# Patient Record
Sex: Female | Born: 1985 | Race: Black or African American | Hispanic: No | Marital: Single | Smoking: Never smoker
Health system: Southern US, Community
[De-identification: ages and names within clinical notes are randomized; demographics above are authoritative.]

## PROBLEM LIST (undated history)

## (undated) ENCOUNTER — Emergency Department (HOSPITAL_COMMUNITY): Admission: EM | Payer: 59

## (undated) ENCOUNTER — Inpatient Hospital Stay (HOSPITAL_COMMUNITY): Payer: Self-pay

## (undated) DIAGNOSIS — K219 Gastro-esophageal reflux disease without esophagitis: Secondary | ICD-10-CM

## (undated) DIAGNOSIS — I10 Essential (primary) hypertension: Secondary | ICD-10-CM

## (undated) DIAGNOSIS — D649 Anemia, unspecified: Secondary | ICD-10-CM

## (undated) DIAGNOSIS — Z8619 Personal history of other infectious and parasitic diseases: Secondary | ICD-10-CM

## (undated) DIAGNOSIS — M9250 Juvenile osteochondrosis of tibia and fibula, unspecified leg: Secondary | ICD-10-CM

## (undated) DIAGNOSIS — D563 Thalassemia minor: Secondary | ICD-10-CM

## (undated) DIAGNOSIS — O039 Complete or unspecified spontaneous abortion without complication: Secondary | ICD-10-CM

## (undated) DIAGNOSIS — M92519 Juvenile osteochondrosis of proximal tibia, unspecified leg: Secondary | ICD-10-CM

## (undated) HISTORY — PX: LEG SURGERY: SHX1003

## (undated) HISTORY — DX: Thalassemia minor: D56.3

## (undated) HISTORY — DX: Gastro-esophageal reflux disease without esophagitis: K21.9

## (undated) HISTORY — PX: TONSILLECTOMY: SUR1361

## (undated) HISTORY — DX: Juvenile osteochondrosis of proximal tibia, unspecified leg: M92.519

## (undated) HISTORY — DX: Juvenile osteochondrosis of tibia and fibula, unspecified leg: M92.50

## (undated) HISTORY — DX: Anemia, unspecified: D64.9

## (undated) HISTORY — DX: Personal history of other infectious and parasitic diseases: Z86.19

---

## 2014-10-12 ENCOUNTER — Emergency Department (HOSPITAL_COMMUNITY)
Admission: EM | Admit: 2014-10-12 | Discharge: 2014-10-12 | Discharge status: Absent without leave | Payer: Self-pay | Source: Home / Self Care

## 2014-10-16 LAB — OB RESULTS CONSOLE HIV ANTIBODY (ROUTINE TESTING): HIV: NONREACTIVE

## 2014-10-16 LAB — OB RESULTS CONSOLE HEPATITIS B SURFACE ANTIGEN: Hepatitis B Surface Ag: NEGATIVE

## 2014-10-16 LAB — OB RESULTS CONSOLE ABO/RH: RH TYPE: POSITIVE

## 2014-10-16 LAB — OB RESULTS CONSOLE ANTIBODY SCREEN: Antibody Screen: NEGATIVE

## 2014-10-16 LAB — OB RESULTS CONSOLE GC/CHLAMYDIA
Chlamydia: NEGATIVE
GC PROBE AMP, GENITAL: NEGATIVE

## 2014-10-16 LAB — OB RESULTS CONSOLE RPR: RPR: NONREACTIVE

## 2014-10-16 LAB — OB RESULTS CONSOLE RUBELLA ANTIBODY, IGM: Rubella: IMMUNE

## 2014-10-18 ENCOUNTER — Encounter (HOSPITAL_COMMUNITY): Payer: Self-pay | Admitting: Emergency Medicine

## 2014-10-18 ENCOUNTER — Emergency Department (HOSPITAL_COMMUNITY)
Admission: EM | Admit: 2014-10-18 | Discharge: 2014-10-18 | Disposition: A | Discharge status: Home / Self Care | Payer: Medicaid Other | Attending: Emergency Medicine | Admitting: Emergency Medicine

## 2014-10-18 DIAGNOSIS — Z3A08 8 weeks gestation of pregnancy: Secondary | ICD-10-CM | POA: Insufficient documentation

## 2014-10-18 DIAGNOSIS — O23591 Infection of other part of genital tract in pregnancy, first trimester: Secondary | ICD-10-CM | POA: Insufficient documentation

## 2014-10-18 DIAGNOSIS — B9689 Other specified bacterial agents as the cause of diseases classified elsewhere: Secondary | ICD-10-CM

## 2014-10-18 DIAGNOSIS — O2311 Infections of bladder in pregnancy, first trimester: Secondary | ICD-10-CM | POA: Diagnosis not present

## 2014-10-18 DIAGNOSIS — O210 Mild hyperemesis gravidarum: Secondary | ICD-10-CM | POA: Diagnosis present

## 2014-10-18 DIAGNOSIS — N3000 Acute cystitis without hematuria: Secondary | ICD-10-CM

## 2014-10-18 DIAGNOSIS — N760 Acute vaginitis: Secondary | ICD-10-CM

## 2014-10-18 HISTORY — DX: Complete or unspecified spontaneous abortion without complication: O03.9

## 2014-10-18 LAB — URINALYSIS, ROUTINE W REFLEX MICROSCOPIC
Bilirubin Urine: NEGATIVE
Glucose, UA: NEGATIVE mg/dL
Hgb urine dipstick: NEGATIVE
Ketones, ur: 40 mg/dL — AB
LEUKOCYTES UA: NEGATIVE
NITRITE: NEGATIVE
PROTEIN: 30 mg/dL — AB
Specific Gravity, Urine: 1.04 — ABNORMAL HIGH (ref 1.005–1.030)
UROBILINOGEN UA: 1 mg/dL (ref 0.0–1.0)
pH: 6 (ref 5.0–8.0)

## 2014-10-18 LAB — COMPREHENSIVE METABOLIC PANEL
ALT: 21 U/L (ref 0–35)
ANION GAP: 14 (ref 5–15)
AST: 18 U/L (ref 0–37)
Albumin: 3.7 g/dL (ref 3.5–5.2)
Alkaline Phosphatase: 40 U/L (ref 39–117)
BUN: 8 mg/dL (ref 6–23)
CALCIUM: 9.8 mg/dL (ref 8.4–10.5)
CO2: 21 mEq/L (ref 19–32)
Chloride: 99 mEq/L (ref 96–112)
Creatinine, Ser: 0.63 mg/dL (ref 0.50–1.10)
GFR calc Af Amer: >90 mL/min (ref >90)
GFR calc non Af Amer: >90 mL/min (ref >90)
GLUCOSE: 105 mg/dL — AB (ref 70–99)
Potassium: 3.6 mEq/L — ABNORMAL LOW (ref 3.7–5.3)
SODIUM: 134 meq/L — AB (ref 137–147)
TOTAL PROTEIN: 7.4 g/dL (ref 6.0–8.3)
Total Bilirubin: 0.3 mg/dL (ref 0.3–1.2)

## 2014-10-18 LAB — CBC WITH DIFFERENTIAL/PLATELET
BASOS PCT: 0 % (ref 0–1)
Basophils Absolute: 0 10*3/uL (ref 0.0–0.1)
EOS PCT: 0 % (ref 0–5)
Eosinophils Absolute: 0 10*3/uL (ref 0.0–0.7)
HCT: 34.8 % — ABNORMAL LOW (ref 36.0–46.0)
Hemoglobin: 11.6 g/dL — ABNORMAL LOW (ref 12.0–15.0)
LYMPHS ABS: 2.3 10*3/uL (ref 0.7–4.0)
Lymphocytes Relative: 33 % (ref 12–46)
MCH: 24.3 pg — ABNORMAL LOW (ref 26.0–34.0)
MCHC: 33.3 g/dL (ref 30.0–36.0)
MCV: 73 fL — AB (ref 78.0–100.0)
Monocytes Absolute: 0.7 10*3/uL (ref 0.1–1.0)
Monocytes Relative: 11 % (ref 3–12)
Neutro Abs: 3.8 10*3/uL (ref 1.7–7.7)
Neutrophils Relative %: 56 % (ref 43–77)
Platelets: 236 10*3/uL (ref 150–400)
RBC: 4.77 MIL/uL (ref 3.87–5.11)
RDW: 13.4 % (ref 11.5–15.5)
WBC: 6.9 10*3/uL (ref 4.0–10.5)

## 2014-10-18 LAB — URINE MICROSCOPIC-ADD ON

## 2014-10-18 LAB — WET PREP, GENITAL
TRICH WET PREP: NONE SEEN
Yeast Wet Prep HPF POC: NONE SEEN

## 2014-10-18 LAB — LIPASE, BLOOD: Lipase: 16 U/L (ref 11–59)

## 2014-10-18 MED ORDER — SODIUM CHLORIDE 0.9 % IV BOLUS (SEPSIS)
1000.0000 mL | Freq: Once | INTRAVENOUS | Status: AC
Start: 2014-10-18 — End: 2014-10-18
  Administered 2014-10-18: 1000 mL via INTRAVENOUS

## 2014-10-18 MED ORDER — METRONIDAZOLE 500 MG PO TABS: 500.0000 mg | ORAL_TABLET | Freq: Two times a day (BID) | ORAL | Status: DC

## 2014-10-18 MED ORDER — CEPHALEXIN 500 MG PO CAPS: 500.0000 mg | ORAL_CAPSULE | Freq: Two times a day (BID) | ORAL | Status: DC

## 2014-10-18 MED ORDER — ONDANSETRON 4 MG PO TBDP: 4.0000 mg | ORAL_TABLET | Freq: Three times a day (TID) | ORAL | Status: DC | PRN

## 2014-10-18 MED ORDER — ONDANSETRON HCL 4 MG/2ML IJ SOLN
4.0000 mg | Freq: Once | INTRAMUSCULAR | Status: AC
  Administered 2014-10-18: 4 mg via INTRAVENOUS
  Filled 2014-10-18: qty 2

## 2014-10-18 NOTE — ED Notes (Signed)
Pt. reports persistent nausea/vomitting for 3 days , pt. stated she is [redacted] weeks pregnant ( G2P0) .

## 2014-10-18 NOTE — Discharge Instructions (Signed)
Urinary Tract Infection Destiny Jones, you were seen today for abdominal pain and vomiting. Your urine shows bacteria, and your pelvic exam shows BV. Take antibiotics as prescribed and follow-up with an OB/GYN physician within 3 days. You can take Tylenol for pain.  Do not take any medication for your cold symptoms as the common cold will eventually go away. If any of her symptoms worsen come back to the emergency department immediately for repeat evaluation. Thank you. A urinary tract infection (UTI) can occur any place along the urinary tract. The tract includes the kidneys, ureters, bladder, and urethra. A type of germ called bacteria often causes a UTI. UTIs are often helped with antibiotic medicine.  HOME CARE   If given, take antibiotics as told by your doctor. Finish them even if you start to feel better.  Drink enough fluids to keep your pee (urine) clear or pale yellow.  Avoid tea, drinks with caffeine, and bubbly (carbonated) drinks.  Pee often. Avoid holding your pee in for a long time.  Pee before and after having sex (intercourse).  Wipe from front to back after you poop (bowel movement) if you are a woman. Use each tissue only once. GET HELP RIGHT AWAY IF:   You have back pain.  You have lower belly (abdominal) pain.  You have chills.  You feel sick to your stomach (nauseous).  You throw up (vomit).  Your burning or discomfort with peeing does not go away.  You have a fever.  Your symptoms are not better in 3 days. MAKE SURE YOU:   Understand these instructions.  Will watch your condition.  Will get help right away if you are not doing well or get worse. Document Released: 04/13/2008 Document Revised: 07/20/2012 Document Reviewed: 05/26/2012 [facility] Patient Information 2015 [place], [place]. This information is not intended to replace advice given to you by your health care provider. Make sure you discuss any questions you have with your health care  provider.  Bacterial Vaginosis Bacterial vaginosis is an infection of the vagina. It happens when too many of certain germs (bacteria) grow in the vagina. HOME CARE  Take your medicine as told by your doctor.  Finish your medicine even if you start to feel better.  Do not have sex until you finish your medicine and are better.  Tell your sex partner that you have an infection. They should see their doctor for treatment.  Practice safe sex. Use condoms. Have only one sex partner. GET HELP IF:  You are not getting better after 3 days of treatment.  You have more grey fluid (discharge) coming from your vagina than before.  You have more pain than before.  You have a fever. MAKE SURE YOU:   Understand these instructions.  Will watch your condition.  Will get help right away if you are not doing well or get worse. Document Released: 08/04/2008 Document Revised: 08/16/2013 Document Reviewed: 06/07/2013 [facility] Patient Information 2015 [place], [place]LL[place]. This information is not intended to replace advice given to you by your health care provider. Make sure you discuss any questions you have with your health care provider.

## 2014-10-18 NOTE — ED Provider Notes (Signed)
CSN: [id number]     Arrival date & time 10/18/14  0229 History  This chart was scribed for Destiny Jones Brennan Karam, MD by Karle PlumberJennifer Tensley, ED Scribe. This patient was seen in room D34C/D34C and the patient's care was started at 2:59 AM.  Chief Complaint  Patient presents with  . Emesis During Pregnancy   The history is provided by the patient. No language interpreter was used.    HPI Comments:  Talbot Grumblingdrian Seedorf is a 28 y.o. pregnant female at 808 weeks gestation, with PMH of miscarriage who presents to the Emergency Department complaining of nausea and vomiting (one episode per day secondary to eating) that started three days ago. She reports associated abdominal cramping, dysuria (she feels is secondary to not being able to drink anything) and constipation. She states she has not been able to keep anything on her stomach, eating only once daily, and reports feeling dehydrated and weak. Pt has recently had a cold and states she has had chest congestion. Eating and drinking makes her symptoms worse. Denies alleviating factors. Denies fever, hematuria, pain or swelling of the lower extremities. Pt states she has her first prenatal visit scheduled.   Past Medical History  Diagnosis Date  . Miscarriage    Past Surgical History  Procedure Laterality Date  . Tonsillectomy    . Leg surgery     No family history on file. History  Substance Use Topics  . Smoking status: Never Smoker   . Smokeless tobacco: Not on file  . Alcohol Use: Yes   OB History    No data available     Review of Systems  Gastrointestinal: Positive for nausea and vomiting.  All other systems reviewed and are negative.   Allergies  Review of patient's allergies indicates no known allergies.  Home Medications   Prior to Admission medications   Not on File   Triage Vitals: BP 133/71 mmHg  Pulse 91  Temp(Src) 97.9 F (36.6 C) (Oral)  Resp 14  SpO2 100% Physical Exam  Constitutional: She is oriented to person, place, and  time. She appears well-developed and well-nourished. No distress.  HENT:  Head: Normocephalic and atraumatic.  Eyes: Conjunctivae and EOM are normal. Pupils are equal, round, and reactive to light. No scleral icterus.  Neck: Normal range of motion. Neck supple. No JVD present. No tracheal deviation present. No thyromegaly present.  Cardiovascular: Normal rate, regular rhythm and normal heart sounds.  Exam reveals no gallop and no friction rub.   No murmur heard. Pulmonary/Chest: Effort normal and breath sounds normal. No respiratory distress. She has no wheezes. She exhibits no tenderness.  Abdominal: Soft. Bowel sounds are normal. She exhibits no distension and no mass. There is tenderness (suprapubic). There is no rebound and no guarding.  Genitourinary:  Live IUP. Fetal heart visualized on bedside ultrasound.  Mild amount of vaginal discharge seen in the vaginal canal. No CMT. No adnexal tenderness.  Musculoskeletal: Normal range of motion. She exhibits no edema or tenderness.  Lymphadenopathy:    She has no cervical adenopathy.  Neurological: She is alert and oriented to person, place, and time.  Skin: Skin is warm and dry. No rash noted. No erythema. No pallor.  Nursing note and vitals reviewed.   ED Course  Procedures (including critical care time) DIAGNOSTIC STUDIES: Oxygen Saturation is 100% on RA, normal by my interpretation.   COORDINATION OF CARE: 3:06 AM- Will order lab work, urinalysis, IV fluids and ultrasound. Offered nausea medication but pt declined stating she  is only nauseous after eating. Will perform pelvic exam. Pt verbalizes understanding and agrees to plan.  Medications  sodium chloride 0.9 % bolus 1,000 mL (not administered)  ondansetron (ZOFRAN) injection 4 mg (not administered)   Labs Review Labs Reviewed  WET PREP, GENITAL - Abnormal; Notable for the following:    Clue Cells Wet Prep HPF POC MANY (*)    WBC, Wet Prep HPF POC FEW (*)    All other  components within normal limits  CBC WITH DIFFERENTIAL - Abnormal; Notable for the following:    Hemoglobin 11.6 (*)    HCT 34.8 (*)    MCV 73.0 (*)    MCH 24.3 (*)    All other components within normal limits  COMPREHENSIVE METABOLIC PANEL - Abnormal; Notable for the following:    Sodium 134 (*)    Potassium 3.6 (*)    Glucose, Bld 105 (*)    All other components within normal limits  URINALYSIS, ROUTINE W REFLEX MICROSCOPIC - Abnormal; Notable for the following:    APPearance CLOUDY (*)    Specific Gravity, Urine 1.040 (*)    Ketones, ur 40 (*)    Protein, ur 30 (*)    All other components within normal limits  URINE MICROSCOPIC-ADD ON - Abnormal; Notable for the following:    Squamous Epithelial / LPF MANY (*)    Bacteria, UA MANY (*)    All other components within normal limits  URINE CULTURE  GC/CHLAMYDIA PROBE AMP  LIPASE, BLOOD    Imaging Review No results found.   EKG Interpretation None      MDM   Final diagnoses:  None    Patient does emergency department for nausea and vomiting in the setting of pregnancy. Bedside ultrasound revealed a live IUP in the fetal heart rate was visualized. Will evaluate with laboratory studies, urinalysis as the patient is symptomatic. She was given Zofran for nausea relief.  Her symptoms are improved with Zofran. Urinalysis shows bacteriuria which needs to be treated in the setting of pregnancy. Also patient has having malodorous vaginal discharge and clue cells on exam. To be sent home with Keflex and Flagyl for treatment. She is advised to follow-up with an OB/GYN physician within 3 days for continued management. Given a prescription for Zofran as well. Her vital signs were within her normal limits and she is safe for discharge.  I personally performed the services described in this documentation, which was scribed in my presence. The recorded information has been reviewed and is accurate.    Destiny Jones Hanne Kegg, MD 10/18/14 [phone]

## 2014-10-18 NOTE — ED Notes (Signed)
Pt. Left with all belongings and refused wheelchair 

## 2014-10-19 LAB — URINE CULTURE: Colony Count: >=100000

## 2014-10-19 LAB — GC/CHLAMYDIA PROBE AMP
CT PROBE, AMP APTIMA: NEGATIVE
GC Probe RNA: NEGATIVE

## 2014-10-23 ENCOUNTER — Inpatient Hospital Stay (HOSPITAL_COMMUNITY)
Admission: AD | Admit: 2014-10-23 | Discharge: 2014-10-23 | Disposition: A | Discharge status: Home / Self Care | Payer: Medicaid Other | Source: Ambulatory Visit | Attending: Obstetrics and Gynecology | Admitting: Obstetrics and Gynecology

## 2014-10-23 ENCOUNTER — Encounter (HOSPITAL_COMMUNITY): Payer: Self-pay | Admitting: *Deleted

## 2014-10-23 DIAGNOSIS — Z3A09 9 weeks gestation of pregnancy: Secondary | ICD-10-CM | POA: Insufficient documentation

## 2014-10-23 DIAGNOSIS — O210 Mild hyperemesis gravidarum: Secondary | ICD-10-CM | POA: Insufficient documentation

## 2014-10-23 DIAGNOSIS — R112 Nausea with vomiting, unspecified: Secondary | ICD-10-CM | POA: Diagnosis present

## 2014-10-23 LAB — URINE MICROSCOPIC-ADD ON

## 2014-10-23 LAB — URINALYSIS, ROUTINE W REFLEX MICROSCOPIC
GLUCOSE, UA: NEGATIVE mg/dL
KETONES UR: 15 mg/dL — AB
Nitrite: NEGATIVE
PH: 6 (ref 5.0–8.0)
Protein, ur: 30 mg/dL — AB
Specific Gravity, Urine: >1.03 — ABNORMAL HIGH (ref 1.005–1.030)
Urobilinogen, UA: 0.2 mg/dL (ref 0.0–1.0)

## 2014-10-23 LAB — POCT PREGNANCY, URINE: Preg Test, Ur: POSITIVE — AB

## 2014-10-23 MED ORDER — METOCLOPRAMIDE HCL 10 MG PO TABS: 10.0000 mg | ORAL_TABLET | Freq: Three times a day (TID) | ORAL | Status: DC | PRN

## 2014-10-23 MED ORDER — METRONIDAZOLE 0.75 % VA GEL: 1.0000 | Freq: Every day | VAGINAL | Status: DC

## 2014-10-23 MED ORDER — DOXYLAMINE-PYRIDOXINE 10-10 MG PO TBEC: 1.0000 | DELAYED_RELEASE_TABLET | Freq: Every evening | ORAL | Status: DC

## 2014-10-23 MED ORDER — LACTATED RINGERS IV SOLN
INTRAVENOUS | Status: DC
  Administered 2014-10-23: 10:00:00 via INTRAVENOUS

## 2014-10-23 MED ORDER — FAMOTIDINE IN NACL 20-0.9 MG/50ML-% IV SOLN
20.0000 mg | Freq: Once | INTRAVENOUS | Status: AC
  Administered 2014-10-23: 20 mg via INTRAVENOUS
  Filled 2014-10-23: qty 50

## 2014-10-23 MED ORDER — SODIUM CHLORIDE 0.9 % IV SOLN: 10.0000 mg | Freq: Once | INTRAVENOUS | Status: DC

## 2014-10-23 MED ORDER — FAMOTIDINE 20 MG PO TABS: 20.0000 mg | ORAL_TABLET | Freq: Two times a day (BID) | ORAL | Status: DC

## 2014-10-23 MED ORDER — LACTATED RINGERS IV BOLUS (SEPSIS)
500.0000 mL | Freq: Once | INTRAVENOUS | Status: AC
  Administered 2014-10-23: 500 mL via INTRAVENOUS

## 2014-10-23 MED ORDER — METOCLOPRAMIDE HCL 5 MG/ML IJ SOLN
5.0000 mg | Freq: Once | INTRAMUSCULAR | Status: AC
  Administered 2014-10-23: 5 mg via INTRAVENOUS
  Filled 2014-10-23: qty 2

## 2014-10-23 NOTE — Progress Notes (Signed)
[facility].  Provider being changed to [facility] as pt has already begun pregnancy care with that practice.  Gerrit HeckJessica Emly, CNM notified that pt is here and ready for provider assessment.

## 2014-10-23 NOTE — MAU Provider Note (Signed)
History    Destiny Jones is a 28 y.o. G2P0010 at 9.1wks who presents, unannounced, for nausea and vomiting.  Patient states she has been unable to eat anything or remain adequately hydrated.  However, patient reports consuming sips of Ensure every am without issues.  Patient also states she is [facility]  Patient reports issues with keeping said medications down.  Patient denies issues with vaginal bleeding, recent illness, or GI concerns.   There are no active problems to display for this patient.   Chief Complaint  Patient presents with  . Nausea   HPI  OB History    Gravida Para Term Preterm AB TAB SAB Ectopic Multiple Living   2    1  1          Past Medical History  Diagnosis Date  . Miscarriage     Past Surgical History  Procedure Laterality Date  . Tonsillectomy    . 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  History  Substance Use Topics  . Smoking status: Never Smoker   . Smokeless tobacco: Not on file  . Alcohol Use: Yes    Allergies:  Allergies  Allergen Reactions  . Shellfish Allergy Hives    Happened at age 89y, has not had shellfish since    Prescriptions prior to admission  Medication Sig Dispense Refill Last Dose  . cephALEXin (KEFLEX) 500 MG capsule Take 1 capsule (500 mg total) by mouth 2 (two) times daily. 14 capsule 0 10/22/2014 at Unknown time  . metroNIDAZOLE (FLAGYL) 500 MG tablet Take 1 tablet (500 mg total) by mouth 2 (two) times daily. One po bid x 7 days 14 tablet 0 10/22/2014 at Unknown time  . ondansetron (ZOFRAN ODT) 4 MG disintegrating tablet Take 1 tablet (4 mg total) by mouth every 8 (eight) hours as needed for nausea or vomiting. 20 tablet 0 10/22/2014 at Unknown time  . Prenatal Vit-Fe Fumarate-FA (PRENATAL MULTIVITAMIN) TABS tablet Take 1 tablet by mouth daily at 12 noon.   Past Month at Unknown time    ROS  See HPI Above Physical  Exam   Blood pressure 117/56, pulse 75, temperature 98.5 F (36.9 C), temperature source Oral, resp. rate 16, height 5\' 6"  (1.676 m), weight 197 lb 6 oz (89.529 kg), last menstrual period 08/20/2014, SpO2 97 %.  Results for orders placed or performed during the hospital encounter of 10/23/14 (from the past 24 hour(s))  Urinalysis, Routine w reflex microscopic     Status: Abnormal   Collection Time: 10/23/14  7:37 AM  Result Value Ref Range   Color, Urine YELLOW YELLOW   APPearance CLOUDY (A) CLEAR   Specific Gravity, Urine >1.030 (H) 1.005 - 1.030   pH 6.0 5.0 - 8.0   Glucose, UA NEGATIVE NEGATIVE mg/dL   Hgb urine dipstick TRACE (A) NEGATIVE   Bilirubin Urine SMALL (A) NEGATIVE   Ketones, ur 15 (A) NEGATIVE mg/dL   Protein, ur 30 (A) NEGATIVE mg/dL   Urobilinogen, UA 0.2 0.0 - 1.0 mg/dL   Nitrite NEGATIVE NEGATIVE   Leukocytes, UA TRACE (A) NEGATIVE  Urine microscopic-add on     Status: Abnormal   Collection Time: 10/23/14  7:37 AM  Result Value Ref Range   Squamous Epithelial / LPF MANY (A) RARE   WBC, UA 3-6 <3 WBC/hpf   Bacteria, UA FEW (A) RARE   Urine-Other  MUCOUS PRESENT   Pregnancy, urine POC     Status: Abnormal   Collection Time: 10/23/14  8:00 AM  Result Value Ref Range   Preg Test, Ur POSITIVE (A) NEGATIVE   Physical Exam  Constitutional: She appears well-developed and well-nourished. No distress.  Cardiovascular: Normal rate, regular rhythm and normal heart sounds.   Respiratory: Effort normal and breath sounds normal.  GI: Soft. Bowel sounds are normal.  Musculoskeletal: Normal range of motion. She exhibits no edema.  Neurological: She is alert.  Skin: Skin is warm and dry.    ED Course  Assessment: IUP at 9.1wks Nausea/Vomiting  Plan: -Start IV - IV bolus followed by continuous infusion -Reglan and Pepcid -Will give bolus and medications then assess via food challenge  Follow Up (1045) -Medications and fluid given -Patient ate saltines and drank  water without issues -Reports feelings of wanting to "throw up," but feels may be more mental than physical -Will continue to watch for another 1/2 hour and then reassess -Patient instructed to discontinue PO antibiotics and antifungals -Will prescribe vaginal Metronidazole and will re-culture urine at next office visit -Patient verbalized need to discontinue -RX for pepcid 20mg  PO BID and Reglan 10mg  PO Q8hrs  -Keep appt as scheduled: 10/31/2014 -Encouraged to call if any questions or concerns arise prior to next scheduled office visit.  -Discharged to home in stable condition  Yuko Coventry LYNN CNM, MSN 10/23/2014 8:56 AM

## 2014-10-23 NOTE — Discharge Instructions (Signed)
Morning Sickness °Morning sickness is when you feel sick to your stomach (nauseous) during pregnancy. You may feel sick to your stomach and throw up (vomit). You may feel sick in the morning, but you can feel this way any time of day. Some women feel very sick to their stomach and cannot stop throwing up (hyperemesis gravidarum). °HOME CARE °· Only take medicines as told by your doctor. °· Take multivitamins as told by your doctor. Taking multivitamins before getting pregnant can stop or lessen the harshness of morning sickness. °· Eat dry toast or unsalted crackers before getting out of bed. °· Eat 5 to 6 small meals a day. °· Eat dry and bland foods like rice and baked potatoes. °· Do not drink liquids with meals. Drink between meals. °· Do not eat greasy, fatty, or spicy foods. °· Have someone cook for you if the smell of food causes you to feel sick or throw up. °· If you feel sick to your stomach after taking prenatal vitamins, take them at night or with a snack. °· Eat protein when you need a snack (nuts, yogurt, cheese). °· Eat unsweetened gelatins for dessert. °· Wear a bracelet used for sea sickness (acupressure wristband). °· Go to a doctor that puts thin needles into certain body points (acupuncture) to improve how you feel. °· Do not smoke. °· Use a humidifier to keep the air in your house free of odors. °· Get lots of fresh air. °GET HELP IF: °· You need medicine to feel better. °· You feel dizzy or lightheaded. °· You are losing weight. °GET HELP RIGHT AWAY IF:  °· You feel very sick to your stomach and cannot stop throwing up. °· You pass out (faint). °MAKE SURE YOU: °· Understand these instructions. °· Will watch your condition. °· Will get help right away if you are not doing well or get worse. °Document Released: 12/03/2004 Document Revised: 10/31/2013 Document Reviewed: 04/12/2013 °ExitCare® Patient Information ©2015 Exit[employer], LLC. This information is not intended to replace advice given to you by  your health care provider. Make sure you discuss any questions you have with your health care provider. ° °

## 2014-10-23 NOTE — MAU Note (Signed)
Pt states n/v throughout pregnancy, unable to keep anything down. Last intercourse 4 days ago.

## 2015-01-29 ENCOUNTER — Ambulatory Visit (HOSPITAL_COMMUNITY): Payer: Medicaid Other | Attending: Obstetrics and Gynecology

## 2015-04-27 ENCOUNTER — Emergency Department (HOSPITAL_COMMUNITY)
Admission: EM | Admit: 2015-04-27 | Discharge: 2015-04-27 | Disposition: A | Discharge status: Home / Self Care | Payer: Medicaid Other | Attending: Emergency Medicine | Admitting: Emergency Medicine

## 2015-04-27 ENCOUNTER — Encounter (HOSPITAL_COMMUNITY): Payer: Self-pay | Admitting: *Deleted

## 2015-04-27 DIAGNOSIS — J029 Acute pharyngitis, unspecified: Secondary | ICD-10-CM | POA: Diagnosis not present

## 2015-04-27 DIAGNOSIS — Z79899 Other long term (current) drug therapy: Secondary | ICD-10-CM | POA: Insufficient documentation

## 2015-04-27 DIAGNOSIS — H9209 Otalgia, unspecified ear: Secondary | ICD-10-CM | POA: Diagnosis not present

## 2015-04-27 DIAGNOSIS — O9989 Other specified diseases and conditions complicating pregnancy, childbirth and the puerperium: Secondary | ICD-10-CM | POA: Diagnosis not present

## 2015-04-27 DIAGNOSIS — Z3A36 36 weeks gestation of pregnancy: Secondary | ICD-10-CM | POA: Insufficient documentation

## 2015-04-27 DIAGNOSIS — O99513 Diseases of the respiratory system complicating pregnancy, third trimester: Secondary | ICD-10-CM | POA: Insufficient documentation

## 2015-04-27 LAB — RAPID STREP SCREEN (MED CTR MEBANE ONLY): Streptococcus, Group A Screen (Direct): NEGATIVE

## 2015-04-27 NOTE — ED Notes (Signed)
Sore throat and earache for 2 days.  She is 36 weeks preg  edc July 18th

## 2015-04-27 NOTE — ED Notes (Signed)
Pt discharged with all belongings. Pt refused wheelchair, ambulated on her own.

## 2015-04-27 NOTE — ED Provider Notes (Signed)
CSN: [id number]     Arrival date & time 04/27/15  0139 History  This chart was scribed for Mirian Mo, MD by Roxy Cedar, ED Scribe. This patient was seen in room D31C/D31C and the patient's care was started at 2:14 AM.   Chief Complaint  Patient presents with  . Sore Throat   Patient is a 29 y.o. female presenting with pharyngitis. The history is provided by the patient. No language interpreter was used.  Sore Throat    HPI Comments: Destiny Jones is a [redacted] week pregnant 29 y.o. female with a PMHx of tonsillectomy, who presents to the Emergency Department complaining of moderate sore throat with associated cough, wheezing, congestion, rhinorrhea, otalgia, and trouble swallowing onset 2 days ago. Patient denies associated fever. She states that her step daughter had onset of viral infection a few days ago. Patient reports GERD associated to pregnancy. She denies vaginal bleeding or discharge.  Past Medical History  Diagnosis Date  . Miscarriage    Past Surgical History  Procedure Laterality Date  . Tonsillectomy    . 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 History  Substance Use Topics  . Smoking status: Never Smoker   . Smokeless tobacco: Not on file  . Alcohol Use: Yes   OB History    Gravida Para Term Preterm AB TAB SAB Ectopic Multiple Living   2    1  1         Review of Systems  HENT: Positive for congestion, ear pain, rhinorrhea, sore throat and trouble swallowing.   Eyes: Negative for discharge and itching.  All other systems reviewed and are negative.  Allergies  Shellfish allergy  Home Medications   Prior to Admission medications   Medication Sig Start Date End Date Taking? Authorizing Provider  ferrous sulfate 325 (65 FE) MG tablet Take 325 mg by mouth daily with breakfast.   Yes Historical Provider, MD  Prenatal Vit-Fe Fumarate-FA (PRENATAL MULTIVITAMIN) TABS tablet Take 1 tablet by mouth daily at 12 noon.   Yes  Historical Provider, MD  Doxylamine-Pyridoxine (DICLEGIS) 10-10 MG TBEC Take 1 tablet by mouth Nightly. Patient not taking: Reported on 04/27/2015 10/23/14   Gerrit Heck, CNM  famotidine (PEPCID) 20 MG tablet Take 1 tablet (20 mg total) by mouth 2 (two) times daily. Patient not taking: Reported on 04/27/2015 10/23/14   Gerrit Heck, CNM  metroNIDAZOLE (METROGEL VAGINAL) 0.75 % vaginal gel Place 1 Applicatorful vaginally at bedtime. Insert one applicator, at bedtime, for 5 nights. Patient not taking: Reported on 04/27/2015 10/23/14   Gerrit Heck, CNM  ondansetron (ZOFRAN ODT) 4 MG disintegrating tablet Take 1 tablet (4 mg total) by mouth every 8 (eight) hours as needed for nausea or vomiting. Patient not taking: Reported on 04/27/2015 10/18/14   Tomasita Crumble, MD   Triage Vitals: BP 129/76 mmHg  Pulse 98  Temp(Src) 98 F (36.7 C)  Resp 18  Ht 5\' 6"  (1.676 m)  Wt 217 lb 7 oz (98.629 kg)  BMI 35.11 kg/m2  SpO2 97%  LMP 08/20/2014  Physical Exam  Constitutional: She is oriented to person, place, and time. She appears well-developed and well-nourished.  HENT:  Head: Normocephalic and atraumatic.  Right Ear: Tympanic membrane and external ear normal.  Left Ear: Tympanic membrane and external ear normal.  Mouth/Throat: No oropharyngeal exudate, posterior oropharyngeal edema or posterior oropharyngeal erythema.  Eyes: Conjunctivae and EOM are normal. Pupils are equal, round, and reactive  to light.  Neck: Normal range of motion. Neck supple.  Cardiovascular: Normal rate, regular rhythm, normal heart sounds and intact distal pulses.   Pulmonary/Chest: Effort normal and breath sounds normal.  Abdominal: Soft. Bowel sounds are normal. There is no tenderness.  Musculoskeletal: Normal range of motion.  Neurological: She is alert and oriented to person, place, and time.  Skin: Skin is warm and dry.  Vitals reviewed.   ED Course  Procedures (including critical care time)  DIAGNOSTIC  STUDIES: Oxygen Saturation is 97% on RA, normal by my interpretation.    COORDINATION OF CARE: 2:16 AM- Discussed plans to order diagnostic strep test. Pt advised of plan for treatment and pt agrees.  Labs Review Labs Reviewed  RAPID STREP SCREEN (NOT AT [facility])  CULTURE, GROUP A STREP    Imaging Review No results found.   EKG Interpretation None     MDM   Final diagnoses:  Pharyngitis    29 y.o. female with pertinent PMH of current pregnancy, no problems presents with sore throat, minimal cough.  History and exam consistent with pharyngitis.  Discussed utility of cxr with only minimal cough, and agreed to defer at this time.  Good fetal movement, no vaginal bleeding or dc, and no problems with pregnancy.  Strep screen negative.  DC home in stable condition.    I have reviewed all laboratory and imaging studies if ordered as above  1. Pharyngitis           Mirian Mo, MD 04/28/15 2350

## 2015-04-27 NOTE — Discharge Instructions (Signed)

## 2015-04-29 LAB — CULTURE, GROUP A STREP: Strep A Culture: NEGATIVE

## 2015-05-03 LAB — OB RESULTS CONSOLE GBS: GBS: NEGATIVE

## 2015-05-16 ENCOUNTER — Ambulatory Visit (INDEPENDENT_AMBULATORY_CARE_PROVIDER_SITE_OTHER): Payer: Self-pay | Admitting: Pediatrics

## 2015-05-16 DIAGNOSIS — Z7681 Expectant parent(s) prebirth pediatrician visit: Secondary | ICD-10-CM | POA: Insufficient documentation

## 2015-05-16 NOTE — Progress Notes (Signed)
Prenatal counseling for impending newborn done-- Z76.81  

## 2015-05-31 ENCOUNTER — Encounter (HOSPITAL_COMMUNITY): Payer: Self-pay | Admitting: *Deleted

## 2015-05-31 ENCOUNTER — Telehealth (HOSPITAL_COMMUNITY): Payer: Self-pay | Admitting: *Deleted

## 2015-05-31 NOTE — Telephone Encounter (Signed)
Preadmission screen  

## 2015-06-02 ENCOUNTER — Inpatient Hospital Stay (HOSPITAL_COMMUNITY): Payer: Medicaid Other

## 2015-06-02 ENCOUNTER — Inpatient Hospital Stay (HOSPITAL_COMMUNITY)
Admission: AD | Admit: 2015-06-02 | Discharge: 2015-06-02 | Disposition: A | Discharge status: Home / Self Care | Payer: Medicaid Other | Source: Ambulatory Visit | Attending: Obstetrics and Gynecology | Admitting: Obstetrics and Gynecology

## 2015-06-02 ENCOUNTER — Encounter (HOSPITAL_COMMUNITY): Payer: Self-pay | Admitting: *Deleted

## 2015-06-02 DIAGNOSIS — Z3A40 40 weeks gestation of pregnancy: Secondary | ICD-10-CM | POA: Insufficient documentation

## 2015-06-02 DIAGNOSIS — O3663X0 Maternal care for excessive fetal growth, third trimester, not applicable or unspecified: Principal | ICD-10-CM | POA: Diagnosis present

## 2015-06-02 DIAGNOSIS — D649 Anemia, unspecified: Secondary | ICD-10-CM | POA: Diagnosis present

## 2015-06-02 DIAGNOSIS — O9989 Other specified diseases and conditions complicating pregnancy, childbirth and the puerperium: Secondary | ICD-10-CM | POA: Insufficient documentation

## 2015-06-02 DIAGNOSIS — R0602 Shortness of breath: Secondary | ICD-10-CM | POA: Insufficient documentation

## 2015-06-02 DIAGNOSIS — R42 Dizziness and giddiness: Secondary | ICD-10-CM | POA: Insufficient documentation

## 2015-06-02 DIAGNOSIS — O480 Post-term pregnancy: Secondary | ICD-10-CM | POA: Diagnosis present

## 2015-06-02 DIAGNOSIS — O3660X0 Maternal care for excessive fetal growth, unspecified trimester, not applicable or unspecified: Secondary | ICD-10-CM | POA: Insufficient documentation

## 2015-06-02 DIAGNOSIS — O9902 Anemia complicating childbirth: Secondary | ICD-10-CM | POA: Diagnosis present

## 2015-06-02 DIAGNOSIS — Z3A41 41 weeks gestation of pregnancy: Secondary | ICD-10-CM | POA: Diagnosis present

## 2015-06-02 DIAGNOSIS — IMO0002 Reserved for concepts with insufficient information to code with codable children: Secondary | ICD-10-CM | POA: Insufficient documentation

## 2015-06-02 DIAGNOSIS — O9962 Diseases of the digestive system complicating childbirth: Secondary | ICD-10-CM | POA: Diagnosis present

## 2015-06-02 DIAGNOSIS — K219 Gastro-esophageal reflux disease without esophagitis: Secondary | ICD-10-CM | POA: Diagnosis present

## 2015-06-02 DIAGNOSIS — D563 Thalassemia minor: Secondary | ICD-10-CM | POA: Diagnosis present

## 2015-06-02 NOTE — MAU Provider Note (Signed)
Destiny Jones is a 29 y.o. G2P0 at 40.6 weeks presented to MAU unannounced c/o sob d/t fetal position. Very concerned of fetal size.  Denies any respiratory conditions i.e., asthma,    History     Patient Active Problem List   Diagnosis Date Noted  . Pediatric pre-birth visit for expectant parent(s) 05/16/2015  . Nausea with vomiting 10/23/2014    Chief Complaint  Patient presents with  . Dizziness   HPI  OB History    Gravida Para Term Preterm AB TAB SAB Ectopic Multiple Living   2    1  1          Past Medical History  Diagnosis Date  . Miscarriage   . Anemia   . GERD (gastroesophageal reflux disease)   . Hx of gonorrhea   . Hx of chlamydia infection   . Thalassemia trait   . Blount's disease   . Hx of varicella     Past Surgical History  Procedure Laterality Date  . Tonsillectomy    . Leg surgery      for Blount's?disease (bow leg)    History reviewed. No pertinent family history.  History  Substance Use Topics  . Smoking status: Never Smoker   . Smokeless tobacco: Not on file  . Alcohol Use: Yes    Allergies:  Allergies  Allergen Reactions  . Oxycodone Itching  . Shellfish Allergy Hives    Happened at age 57y, has not had shellfish since    Prescriptions prior to admission  Medication Sig Dispense Refill Last Dose  . ferrous sulfate 325 (65 FE) MG tablet Take 325 mg by mouth daily with breakfast.   06/02/2015 at Unknown time  . Prenatal Vit-Fe Fumarate-FA (PRENATAL MULTIVITAMIN) TABS tablet Take 1 tablet by mouth daily at 12 noon.   06/02/2015 at Unknown time  . Doxylamine-Pyridoxine (DICLEGIS) 10-10 MG TBEC Take 1 tablet by mouth Nightly. (Patient not taking: Reported on 04/27/2015) 60 tablet 4 Not Taking at Unknown time  . famotidine (PEPCID) 20 MG tablet Take 1 tablet (20 mg total) by mouth 2 (two) times daily. (Patient not taking: Reported on 04/27/2015) 60 tablet 4 Not Taking at Unknown time  . metroNIDAZOLE (METROGEL VAGINAL) 0.75 % vaginal gel  Place 1 Applicatorful vaginally at bedtime. Insert one applicator, at bedtime, for 5 nights. (Patient not taking: Reported on 04/27/2015) 70 g 0 Not Taking at Unknown time  . ondansetron (ZOFRAN ODT) 4 MG disintegrating tablet Take 1 tablet (4 mg total) by mouth every 8 (eight) hours as needed for nausea or vomiting. (Patient not taking: Reported on 04/27/2015) 20 tablet 0 Not Taking at Unknown time    ROS See HPI above, all other systems are negative  Physical Exam   Blood pressure 117/76, pulse 100, temperature 98.1 F (36.7 C), temperature source Oral, resp. rate 20, last menstrual period 08/20/2014, SpO2 100 %.  Physical Exam Ext:  WNL ABD: Soft, non tender to palpation, no rebound or guarding SVE: deferred No sign of DVT Lungs clear bilaterally    ED Course  Assessment: IUP at  40.6 weeks Membranes: intact FHR: Category 1 CTX:  none   Plan: [place] AFI/BPP/ EFW    Vermon Grays, CNM, MSN 06/02/2015. 5:08 PM     MAU Addendum Note 1900  [place] report FHR 148, AFI 15.35, BPP 8/8, EFW 10lb 8oz Category 1 strip  Discussion of CS vs SVD.  Pt desires a CS   Plan: -Discussed bleeding and labor Precautions -Encouraged to call if  any questions or concerns arise prior to next scheduled office visit.  -Discharged to home in stable condition -Consulted with Dr. Eunice Blase Berklee Battey, CNM, MSN 06/02/2015. 6:47 PM

## 2015-06-03 ENCOUNTER — Inpatient Hospital Stay (HOSPITAL_COMMUNITY)
Admission: RE | Admit: 2015-06-03 | Discharge: 2015-06-06 | DRG: 766 | Disposition: A | Discharge status: Home / Self Care | Payer: Medicaid Other | Source: Ambulatory Visit | Attending: Obstetrics and Gynecology | Admitting: Obstetrics and Gynecology

## 2015-06-03 ENCOUNTER — Encounter (HOSPITAL_COMMUNITY)
Admission: RE | Disposition: A | Discharge status: Home / Self Care | Payer: Self-pay | Source: Ambulatory Visit | Attending: Obstetrics and Gynecology

## 2015-06-03 ENCOUNTER — Encounter (HOSPITAL_COMMUNITY): Payer: Self-pay | Admitting: *Deleted

## 2015-06-03 ENCOUNTER — Other Ambulatory Visit: Payer: Self-pay | Admitting: Obstetrics and Gynecology

## 2015-06-03 ENCOUNTER — Inpatient Hospital Stay (HOSPITAL_COMMUNITY): Payer: Medicaid Other | Admitting: Anesthesiology

## 2015-06-03 DIAGNOSIS — O480 Post-term pregnancy: Secondary | ICD-10-CM | POA: Diagnosis present

## 2015-06-03 DIAGNOSIS — K219 Gastro-esophageal reflux disease without esophagitis: Secondary | ICD-10-CM | POA: Diagnosis present

## 2015-06-03 DIAGNOSIS — O9902 Anemia complicating childbirth: Secondary | ICD-10-CM | POA: Diagnosis present

## 2015-06-03 DIAGNOSIS — Z98891 History of uterine scar from previous surgery: Secondary | ICD-10-CM

## 2015-06-03 DIAGNOSIS — D649 Anemia, unspecified: Secondary | ICD-10-CM | POA: Diagnosis present

## 2015-06-03 DIAGNOSIS — O9962 Diseases of the digestive system complicating childbirth: Secondary | ICD-10-CM | POA: Diagnosis present

## 2015-06-03 DIAGNOSIS — D563 Thalassemia minor: Secondary | ICD-10-CM | POA: Diagnosis present

## 2015-06-03 DIAGNOSIS — O3663X0 Maternal care for excessive fetal growth, third trimester, not applicable or unspecified: Secondary | ICD-10-CM | POA: Diagnosis present

## 2015-06-03 DIAGNOSIS — Z3A41 41 weeks gestation of pregnancy: Secondary | ICD-10-CM | POA: Diagnosis present

## 2015-06-03 LAB — CBC
HEMATOCRIT: 29.9 % — AB (ref 36.0–46.0)
Hemoglobin: 9.5 g/dL — ABNORMAL LOW (ref 12.0–15.0)
MCH: 23.9 pg — ABNORMAL LOW (ref 26.0–34.0)
MCHC: 31.8 g/dL (ref 30.0–36.0)
MCV: 75.1 fL — ABNORMAL LOW (ref 78.0–100.0)
PLATELETS: 167 10*3/uL (ref 150–400)
RBC: 3.98 MIL/uL (ref 3.87–5.11)
RDW: 15.8 % — AB (ref 11.5–15.5)
WBC: 7.9 10*3/uL (ref 4.0–10.5)

## 2015-06-03 LAB — TYPE AND SCREEN
ABO/RH(D): O POS
ANTIBODY SCREEN: NEGATIVE

## 2015-06-03 LAB — ABO/RH: ABO/RH(D): O POS

## 2015-06-03 SURGERY — Surgical Case
Anesthesia: Spinal

## 2015-06-03 MED ORDER — PHENYLEPHRINE 40 MCG/ML (10ML) SYRINGE FOR IV PUSH (FOR BLOOD PRESSURE SUPPORT)
PREFILLED_SYRINGE | INTRAVENOUS | Status: AC
  Filled 2015-06-03: qty 10

## 2015-06-03 MED ORDER — DIPHENHYDRAMINE HCL 50 MG/ML IJ SOLN: 12.5000 mg | INTRAMUSCULAR | Status: DC | PRN

## 2015-06-03 MED ORDER — SIMETHICONE 80 MG PO CHEW
80.0000 mg | CHEWABLE_TABLET | ORAL | Status: DC
  Administered 2015-06-03 – 2015-06-05 (×3): 80 mg via ORAL
  Filled 2015-06-03 (×3): qty 1

## 2015-06-03 MED ORDER — LACTATED RINGERS IV SOLN
INTRAVENOUS | Status: DC | PRN
  Administered 2015-06-03: 15:00:00 via INTRAVENOUS

## 2015-06-03 MED ORDER — PHENYLEPHRINE 8 MG IN D5W 100 ML (0.08MG/ML) PREMIX OPTIME
INJECTION | INTRAVENOUS | Status: DC | PRN
  Administered 2015-06-03: 60 ug/min via INTRAVENOUS

## 2015-06-03 MED ORDER — LACTATED RINGERS IV SOLN
INTRAVENOUS | Status: DC | PRN
  Administered 2015-06-03 (×3): via INTRAVENOUS

## 2015-06-03 MED ORDER — SCOPOLAMINE 1 MG/3DAYS TD PT72: 1.0000 | MEDICATED_PATCH | Freq: Once | TRANSDERMAL | Status: DC

## 2015-06-03 MED ORDER — LACTATED RINGERS IV SOLN
INTRAVENOUS | Status: DC
  Administered 2015-06-03: 21:00:00 via INTRAVENOUS

## 2015-06-03 MED ORDER — SENNOSIDES-DOCUSATE SODIUM 8.6-50 MG PO TABS
2.0000 | ORAL_TABLET | ORAL | Status: DC
  Administered 2015-06-03 – 2015-06-05 (×3): 2 via ORAL
  Filled 2015-06-03 (×3): qty 2

## 2015-06-03 MED ORDER — SODIUM CHLORIDE 0.9 % IJ SOLN: 3.0000 mL | INTRAMUSCULAR | Status: DC | PRN

## 2015-06-03 MED ORDER — SCOPOLAMINE 1 MG/3DAYS TD PT72
MEDICATED_PATCH | TRANSDERMAL | Status: AC
  Filled 2015-06-03: qty 1

## 2015-06-03 MED ORDER — OXYTOCIN 40 UNITS IN LACTATED RINGERS INFUSION - SIMPLE MED
INTRAVENOUS | Status: DC | PRN
  Administered 2015-06-03: 40 [IU] via INTRAVENOUS

## 2015-06-03 MED ORDER — PHENYLEPHRINE 8 MG IN D5W 100 ML (0.08MG/ML) PREMIX OPTIME
INJECTION | INTRAVENOUS | Status: AC
  Filled 2015-06-03: qty 100

## 2015-06-03 MED ORDER — KETOROLAC TROMETHAMINE 30 MG/ML IJ SOLN
INTRAMUSCULAR | Status: AC
  Administered 2015-06-03: 30 mg via INTRAMUSCULAR
  Filled 2015-06-03: qty 1

## 2015-06-03 MED ORDER — ACETAMINOPHEN 500 MG PO TABS
1000.0000 mg | ORAL_TABLET | Freq: Four times a day (QID) | ORAL | Status: AC
  Administered 2015-06-03 – 2015-06-04 (×3): 1000 mg via ORAL
  Filled 2015-06-03 (×3): qty 2

## 2015-06-03 MED ORDER — FENTANYL CITRATE (PF) 100 MCG/2ML IJ SOLN
INTRAMUSCULAR | Status: AC
  Filled 2015-06-03: qty 2

## 2015-06-03 MED ORDER — SIMETHICONE 80 MG PO CHEW
80.0000 mg | CHEWABLE_TABLET | Freq: Three times a day (TID) | ORAL | Status: DC
  Administered 2015-06-04 – 2015-06-06 (×6): 80 mg via ORAL
  Filled 2015-06-03 (×6): qty 1

## 2015-06-03 MED ORDER — ONDANSETRON HCL 4 MG/2ML IJ SOLN
INTRAMUSCULAR | Status: DC | PRN
  Administered 2015-06-03: 4 mg via INTRAVENOUS

## 2015-06-03 MED ORDER — ONDANSETRON HCL 4 MG/2ML IJ SOLN
INTRAMUSCULAR | Status: AC
  Filled 2015-06-03: qty 2

## 2015-06-03 MED ORDER — DIBUCAINE 1 % RE OINT: 1.0000 "application " | TOPICAL_OINTMENT | RECTAL | Status: DC | PRN

## 2015-06-03 MED ORDER — LANOLIN HYDROUS EX OINT
1.0000 | TOPICAL_OINTMENT | CUTANEOUS | Status: DC | PRN
Start: 2015-06-03 — End: 2015-06-06

## 2015-06-03 MED ORDER — 0.9 % SODIUM CHLORIDE (POUR BTL) OPTIME
TOPICAL | Status: DC | PRN
  Administered 2015-06-03: 1000 mL

## 2015-06-03 MED ORDER — DIPHENHYDRAMINE HCL 25 MG PO CAPS
25.0000 mg | ORAL_CAPSULE | ORAL | Status: DC | PRN
  Administered 2015-06-06: 25 mg via ORAL

## 2015-06-03 MED ORDER — ONDANSETRON HCL 4 MG/2ML IJ SOLN
4.0000 mg | Freq: Three times a day (TID) | INTRAMUSCULAR | Status: DC | PRN
Start: 2015-06-03 — End: 2015-06-06

## 2015-06-03 MED ORDER — CEFAZOLIN SODIUM-DEXTROSE 2-3 GM-% IV SOLR
2.0000 g | INTRAVENOUS | Status: AC
  Administered 2015-06-03: 2 g via INTRAVENOUS

## 2015-06-03 MED ORDER — DIPHENHYDRAMINE HCL 25 MG PO CAPS
25.0000 mg | ORAL_CAPSULE | Freq: Four times a day (QID) | ORAL | Status: DC | PRN
  Filled 2015-06-03: qty 1

## 2015-06-03 MED ORDER — CEFAZOLIN SODIUM-DEXTROSE 2-3 GM-% IV SOLR
INTRAVENOUS | Status: AC
  Filled 2015-06-03: qty 50

## 2015-06-03 MED ORDER — LACTATED RINGERS IV SOLN
Freq: Once | INTRAVENOUS | Status: AC
  Administered 2015-06-03: 13:00:00 via INTRAVENOUS

## 2015-06-03 MED ORDER — MENTHOL 3 MG MT LOZG: 1.0000 | LOZENGE | OROMUCOSAL | Status: DC | PRN

## 2015-06-03 MED ORDER — NALBUPHINE HCL 10 MG/ML IJ SOLN: 5.0000 mg | Freq: Once | INTRAMUSCULAR | Status: AC | PRN

## 2015-06-03 MED ORDER — SIMETHICONE 80 MG PO CHEW: 80.0000 mg | CHEWABLE_TABLET | ORAL | Status: DC | PRN

## 2015-06-03 MED ORDER — MORPHINE SULFATE (PF) 0.5 MG/ML IJ SOLN
INTRAMUSCULAR | Status: DC | PRN
  Administered 2015-06-03: .1 mg via EPIDURAL

## 2015-06-03 MED ORDER — FENTANYL CITRATE (PF) 100 MCG/2ML IJ SOLN
INTRAMUSCULAR | Status: DC | PRN
  Administered 2015-06-03: 25 ug via INTRATHECAL

## 2015-06-03 MED ORDER — NALOXONE HCL 0.4 MG/ML IJ SOLN: 0.4000 mg | INTRAMUSCULAR | Status: DC | PRN

## 2015-06-03 MED ORDER — NALBUPHINE HCL 10 MG/ML IJ SOLN
5.0000 mg | INTRAMUSCULAR | Status: DC | PRN
  Administered 2015-06-03 – 2015-06-04 (×2): 5 mg via SUBCUTANEOUS
  Filled 2015-06-03: qty 1

## 2015-06-03 MED ORDER — ACETAMINOPHEN 160 MG/5ML PO SOLN
ORAL | Status: AC
  Filled 2015-06-03: qty 40.6

## 2015-06-03 MED ORDER — HYDROMORPHONE HCL 2 MG PO TABS
2.0000 mg | ORAL_TABLET | Freq: Four times a day (QID) | ORAL | Status: DC | PRN
  Administered 2015-06-04 – 2015-06-06 (×5): 2 mg via ORAL
  Filled 2015-06-03 (×5): qty 1

## 2015-06-03 MED ORDER — OXYTOCIN 10 UNIT/ML IJ SOLN
INTRAMUSCULAR | Status: AC
  Filled 2015-06-03: qty 4

## 2015-06-03 MED ORDER — PRENATAL MULTIVITAMIN CH
1.0000 | ORAL_TABLET | Freq: Every day | ORAL | Status: DC
  Administered 2015-06-04 – 2015-06-06 (×3): 1 via ORAL
  Filled 2015-06-03 (×3): qty 1

## 2015-06-03 MED ORDER — SCOPOLAMINE 1 MG/3DAYS TD PT72
1.0000 | MEDICATED_PATCH | Freq: Once | TRANSDERMAL | Status: DC
  Administered 2015-06-03: 1.5 mg via TRANSDERMAL

## 2015-06-03 MED ORDER — NALBUPHINE HCL 10 MG/ML IJ SOLN: 5.0000 mg | INTRAMUSCULAR | Status: DC | PRN

## 2015-06-03 MED ORDER — FENTANYL CITRATE (PF) 100 MCG/2ML IJ SOLN
25.0000 ug | INTRAMUSCULAR | Status: DC | PRN
  Administered 2015-06-03: 50 ug via INTRAVENOUS
  Administered 2015-06-03: 25 ug via INTRAVENOUS

## 2015-06-03 MED ORDER — FENTANYL CITRATE (PF) 100 MCG/2ML IJ SOLN
INTRAMUSCULAR | Status: AC
  Administered 2015-06-03: 50 ug via INTRAVENOUS
  Filled 2015-06-03: qty 2

## 2015-06-03 MED ORDER — MORPHINE SULFATE 0.5 MG/ML IJ SOLN
INTRAMUSCULAR | Status: AC
  Filled 2015-06-03: qty 10

## 2015-06-03 MED ORDER — MEPERIDINE HCL 25 MG/ML IJ SOLN: 6.2500 mg | INTRAMUSCULAR | Status: DC | PRN

## 2015-06-03 MED ORDER — TETANUS-DIPHTH-ACELL PERTUSSIS 5-2.5-18.5 LF-MCG/0.5 IM SUSP: 0.5000 mL | Freq: Once | INTRAMUSCULAR | Status: DC

## 2015-06-03 MED ORDER — WITCH HAZEL-GLYCERIN EX PADS: 1.0000 "application " | MEDICATED_PAD | CUTANEOUS | Status: DC | PRN

## 2015-06-03 MED ORDER — PHENYLEPHRINE HCL 10 MG/ML IJ SOLN
INTRAMUSCULAR | Status: DC | PRN
  Administered 2015-06-03 (×2): 80 ug via INTRAVENOUS
  Administered 2015-06-03: 120 ug via INTRAVENOUS
  Administered 2015-06-03: 40 ug via INTRAVENOUS

## 2015-06-03 MED ORDER — LACTATED RINGERS IV SOLN: INTRAVENOUS | Status: DC

## 2015-06-03 MED ORDER — ZOLPIDEM TARTRATE 5 MG PO TABS: 5.0000 mg | ORAL_TABLET | Freq: Every evening | ORAL | Status: DC | PRN

## 2015-06-03 MED ORDER — NALBUPHINE HCL 10 MG/ML IJ SOLN
INTRAMUSCULAR | Status: AC
  Filled 2015-06-03: qty 1

## 2015-06-03 MED ORDER — SCOPOLAMINE 1 MG/3DAYS TD PT72
MEDICATED_PATCH | TRANSDERMAL | Status: AC
Start: 2015-06-03 — End: 2015-06-06
  Administered 2015-06-03: 1.5 mg via TRANSDERMAL
  Filled 2015-06-03: qty 1

## 2015-06-03 MED ORDER — IBUPROFEN 600 MG PO TABS
600.0000 mg | ORAL_TABLET | Freq: Four times a day (QID) | ORAL | Status: DC
  Administered 2015-06-03 – 2015-06-06 (×11): 600 mg via ORAL
  Filled 2015-06-03 (×11): qty 1

## 2015-06-03 MED ORDER — IBUPROFEN 600 MG PO TABS: 600.0000 mg | ORAL_TABLET | Freq: Four times a day (QID) | ORAL | Status: DC

## 2015-06-03 MED ORDER — ACETAMINOPHEN 160 MG/5ML PO SOLN
975.0000 mg | Freq: Once | ORAL | Status: AC
  Administered 2015-06-03: 975 mg via ORAL

## 2015-06-03 MED ORDER — OXYTOCIN 40 UNITS IN LACTATED RINGERS INFUSION - SIMPLE MED: 62.5000 mL/h | INTRAVENOUS | Status: AC

## 2015-06-03 MED ORDER — ACETAMINOPHEN 325 MG PO TABS: 650.0000 mg | ORAL_TABLET | ORAL | Status: DC | PRN

## 2015-06-03 MED ORDER — NALOXONE HCL 1 MG/ML IJ SOLN: 1.0000 ug/kg/h | INTRAVENOUS | Status: DC | PRN

## 2015-06-03 MED ORDER — KETOROLAC TROMETHAMINE 30 MG/ML IJ SOLN
30.0000 mg | Freq: Once | INTRAMUSCULAR | Status: AC
  Administered 2015-06-03: 30 mg via INTRAMUSCULAR

## 2015-06-03 SURGICAL SUPPLY — 30 items
BENZOIN TINCTURE PRP APPL 2/3 (GAUZE/BANDAGES/DRESSINGS) ×2 IMPLANT
CLAMP CORD UMBIL (MISCELLANEOUS) IMPLANT
CLOTH BEACON ORANGE TIMEOUT ST (SAFETY) ×2 IMPLANT
CONTAINER PREFILL 10% NBF 15ML (MISCELLANEOUS) IMPLANT
DRAPE SHEET LG 3/4 BI-LAMINATE (DRAPES) IMPLANT
DRSG OPSITE POSTOP 4X10 (GAUZE/BANDAGES/DRESSINGS) ×2 IMPLANT
DURAPREP 26ML APPLICATOR (WOUND CARE) ×2 IMPLANT
ELECT REM PT RETURN 9FT ADLT (ELECTROSURGICAL) ×2
ELECTRODE REM PT RTRN 9FT ADLT (ELECTROSURGICAL) ×1 IMPLANT
EXTRACTOR VACUUM M CUP 4 TUBE (SUCTIONS) IMPLANT
GLOVE BIO SURGEON STRL SZ7.5 (GLOVE) ×2 IMPLANT
GLOVE BIOGEL PI IND STRL 7.5 (GLOVE) ×1 IMPLANT
GLOVE BIOGEL PI INDICATOR 7.5 (GLOVE) ×1
GOWN STRL REUS W/TWL LRG LVL3 (GOWN DISPOSABLE) ×4 IMPLANT
KIT ABG SYR 3ML LUER SLIP (SYRINGE) IMPLANT
NEEDLE HYPO 25X5/8 SAFETYGLIDE (NEEDLE) IMPLANT
NS IRRIG 1000ML POUR BTL (IV SOLUTION) ×2 IMPLANT
PACK C SECTION WH (CUSTOM PROCEDURE TRAY) ×2 IMPLANT
PAD OB MATERNITY 4.3X12.25 (PERSONAL CARE ITEMS) ×2 IMPLANT
RTRCTR C-SECT PINK 25CM LRG (MISCELLANEOUS) ×2 IMPLANT
STRIP CLOSURE SKIN 1/2X4 (GAUZE/BANDAGES/DRESSINGS) ×2 IMPLANT
SUT CHROMIC 2 0 CT 1 (SUTURE) ×2 IMPLANT
SUT MNCRL AB 3-0 PS2 27 (SUTURE) ×2 IMPLANT
SUT PLAIN 0 NONE (SUTURE) IMPLANT
SUT PLAIN 2 0 XLH (SUTURE) ×2 IMPLANT
SUT VIC AB 0 CT1 36 (SUTURE) ×2 IMPLANT
SUT VIC AB 0 CTX 36 (SUTURE) ×3
SUT VIC AB 0 CTX36XBRD ANBCTRL (SUTURE) ×3 IMPLANT
TOWEL OR 17X24 6PK STRL BLUE (TOWEL DISPOSABLE) ×2 IMPLANT
TRAY FOLEY CATH SILVER 14FR (SET/KITS/TRAYS/PACK) ×2 IMPLANT

## 2015-06-03 NOTE — Lactation Note (Signed)
This note was copied from the chart of Girl Zuleyka Kloc. Lactation Consultation Note Initial visit at 7 hours of age.  Mom reports a few good feedings, but she is unsure of how much baby is getting.  Discussed ways to know if baby is getting enough and encouraged to keep record of feedings.  Baby asleep in crib.  [facility] LC resources given and discussed.  Encouraged to feed with early cues on demand.  Early newborn behavior discussed.  Hand expression demonstrated no colostrum visible.  Mom to call for assist as needed.     Patient Name: Girl Laverle Pillard [id number] Date: 06/03/2015 Reason for consult: Initial assessment   Maternal Data Has patient been taught Hand Expression?: Yes Does the patient have breastfeeding experience prior to this delivery?: No  Feeding Feeding Type: Breast Fed Length of feed: 15 min  LATCH Score/Interventions Latch: Grasps breast easily, tongue down, lips flanged, rhythmical sucking.  Audible Swallowing: None Intervention(s): Hand expression  Type of Nipple: Everted at rest and after stimulation  Comfort (Breast/Nipple): Soft / non-tender     Hold (Positioning): Assistance needed to correctly position infant at breast and maintain latch.  LATCH Score: 7  Lactation Tools Discussed/Used     Consult Status Consult Status: Follow-up Date: 06/04/15 Follow-up type: In-patient    Beverely Risen Arvella Merles 06/03/2015, 10:46 PM

## 2015-06-03 NOTE — H&P (Signed)
Destiny Jones is a 29 y.o. female, G2 P0 at 41.0 weeks, schedule CS for suspected macrosomia  Patient Active Problem List   Diagnosis Date Noted  . Post-dates pregnancy   . Fetal macrosomia   . [redacted] weeks gestation of pregnancy   . Pediatric pre-birth visit for expectant parent(s) 05/16/2015  . Nausea with vomiting 10/23/2014    Pregnancy Course: Patient entered care at 8.1 weeks.   EDC of 05/27/15 was established by LMP.      [place] evaluations:   12.3 weeks - Dating: FHR 151, anteverted uterus  18.3 weeks - Anatomy: EFW 8oz - 31.8, FHR 149, breech, posterior placenta, female, bilateral polydactly     28.4 weeks - FU: EFW 2lb 15oz - 57.4, AFI 21.5, FHR 153, vertex, posterior placenta  40.6 weeks - FHR 148, AFI 15.35, BPP 8/8, EFW 10lb 8oz  Significant prenatal events:  PT STATES SHE HAS THALASSEMIA TRAIT, amenia, allergy to shell fish Last evaluation:   40.1 weeks   VE:0/20/-1 on 05/28/15   Reason for admission:  CS for suspected macrosomia  Pt States:   Contractions Frequency: none         Contraction severity: n/a         Fetal activity: +FM  OB History    Gravida Para Term Preterm AB TAB SAB Ectopic Multiple Living   Past Medical History  Diagnosis Date  . Miscarriage   . Anemia   . GERD (gastroesophageal reflux disease)   . Hx of gonorrhea   . Hx of chlamydia infection   . Thalassemia trait   . Blount's disease   . Hx of varicella    Past Surgical History  Procedure Laterality Date  . Tonsillectomy    . Leg surgery      for Blount's?disease (bow leg)   Family History: family history is not on file. Social History:  reports that she has never smoked. She does not have any smokeless tobacco history on file. She reports that she drinks alcohol. She reports that she does not use illicit drugs.   Prenatal Transfer Tool  Maternal Diabetes: No Genetic Screening: Normal Maternal Ultrasounds/Referrals: Normal Fetal Ultrasounds or other Referrals:   Other: Bilateral polydactly Maternal Substance Abuse:  No Significant Maternal Medications:  None Significant Maternal Lab Results: None   ROS:  See HPI above, all other systems are negative  Allergies  Allergen Reactions  . Oxycodone Itching  . Shellfish Allergy Hives    Happened at age 3y, has not had shellfish since      Last menstrual period 08/20/2014.  Maternal Exam:  Uterine Assessment: Contraction frequency is rare.  Abdomen: Gravid, non tender. Fundal height is aga.  Normal external genitalia, vulva, cervix, uterus and adnexa.  No lesions noted on exam.  Pelvis adequate for delivery.  Fetal presentation: Vertex by Leopold's   Fetal Exam:  Monitor Surveillance : Continuous Monitoring Mode: Ultrasound.  NICHD: Category CTXs: none EFW   10.8 lbs by [place] 06/02/15  Physical Exam: Nursing note and vitals reviewed General: alert and cooperative She appears well nourished Psychiatric: Normal mood and affect. Her behavior is normal Head: Normocephalic Eyes: Pupils are equal, round, and reactive to light Neck: Normal range of motion Cardiovascular: RRR without murmur  Respiratory: CTAB. Effort normal  Abd: soft, non-tender, +BS, no rebound, no guarding  Genitourinary: Vagina normal  Neurological: A&Ox3 Skin: Warm and dry  Musculoskeletal: Normal range of motion  Homan's sign negative bilaterally No evidence of DVTs.  Edema: Minimal bilaterally non-pitting edema DTR: 2+ Clonus: None   Prenatal labs: ABO, Rh: O/Positive/-- (12/08 0000) Antibody: Negative (12/08 0000) Rubella:    immune RPR: Nonreactive (12/08 0000)  HBsAg: Negative (12/08 0000)  HIV: Non-reactive (12/08 0000)  GBS: Negative (06/24 0000) Sickle cell/Hgb electrophoresis:  WNL Pap:  wnl GC:   negative Chlamydia: Negative Genetic screenings:  negative Glucola:   wnl  Assessment:  IUP at 41.0 weeks NICHD: Category 1 Membranes: intact GBS negative Diagnosis: macrosomia  Plan:  Admit  to BS for schedule primary CS d/t elective secondary to suspected macrosomia R&B of CS reviewed with patient and family.  Pt and family verbalize understanding of the procedure and agree with treatment plan.  Possibility of needing a blood transfusion reviewed.   Pt will accept blood products.   Routine Pre-OP orders Ancef per protocol May have a clear/thin diet 6 hours after CS, advance as tolerate 12 hours after CS      Magdiel Bartles, CNM, MSN 06/03/2015, 9:08 AM

## 2015-06-03 NOTE — Interval H&P Note (Signed)
History and Physical Interval Note:  06/03/2015 2:04 PM  Destiny Jones  has presented today for surgery, with the diagnosis of Macrosomia  The various methods of treatment have been discussed with the patient and family. After consideration of risks, benefits and other options for treatment, the patient has consented to  Procedure(s): CESAREAN SECTION (N/A) as a surgical intervention .  The patient's history has been reviewed, patient examined, no change in status, stable for surgery.  I have reviewed the patient's chart and labs.  Questions were answered to the patient's satisfaction.     Purcell Nails

## 2015-06-03 NOTE — Anesthesia Postprocedure Evaluation (Signed)
  Anesthesia Post-op Note  Patient: Destiny Jones  Procedure(s) Performed: Procedure(s): CESAREAN SECTION (N/A)  Patient is awake, responsive, moving her legs, and has signs of resolution of her numbness. Pain and nausea are reasonably well controlled. Vital signs are stable and clinically acceptable. Oxygen saturation is clinically acceptable. There are no apparent anesthetic complications at this time. Patient is ready for discharge.

## 2015-06-03 NOTE — Anesthesia Preprocedure Evaluation (Signed)
Anesthesia Evaluation  Patient identified by MRN, date of birth, ID band Patient awake    Reviewed: Allergy & Precautions, H&P , Patient's Chart, lab work & pertinent test results  Airway Mallampati: II  TM Distance: >3 FB Neck ROM: full    Dental no notable dental hx.    Pulmonary  breath sounds clear to auscultation  Pulmonary exam normal       Cardiovascular Exercise Tolerance: Good Rhythm:regular Rate:Normal     Neuro/Psych    GI/Hepatic GERD-  ,  Endo/Other    Renal/GU      Musculoskeletal   Abdominal   Peds  Hematology  (+) anemia ,   Anesthesia Other Findings   Reproductive/Obstetrics                             Anesthesia Physical Anesthesia Plan  ASA: II  Anesthesia Plan: Spinal   Post-op Pain Management:    Induction:   Airway Management Planned:   Additional Equipment:   Intra-op Plan:   Post-operative Plan:   Informed Consent: I have reviewed the patients History and Physical, chart, labs and discussed the procedure including the risks, benefits and alternatives for the proposed anesthesia with the patient or authorized representative who has indicated his/her understanding and acceptance.   Dental Advisory Given  Plan Discussed with: CRNA  Anesthesia Plan Comments: (Lab work confirmed with CRNA in room. Platelets okay. Discussed spinal anesthetic, and patient consents to the procedure:  included risk of possible headache,backache, failed block, allergic reaction, and nerve injury. This patient was asked if she had any questions or concerns before the procedure started. )        Anesthesia Quick Evaluation

## 2015-06-03 NOTE — Op Note (Signed)
Cesarean Section Procedure Note  Indications: P1 at 41wks desiring elective c-section secondary to LGA/macrosomia.  Pre-operative Diagnosis: 1.41wks 2.Macrosomia   Post-operative Diagnosis: 1.41wks 2.Macrosomia   Procedure: CESAREAN SECTION  Surgeon: Osborn Coho, MD    Assistants: Adelina Mings, CNM  Anesthesia: Regional  Anesthesiologist: Cristela Blue, MD   Procedure Details  The patient was taken to the operating room secondary to macrosomia after the risks, benefits, complications, treatment options, and expected outcomes were discussed with the patient.  The patient concurred with the proposed plan, giving informed consent which was signed and witnessed. The patient was taken to Operating Room 1, identified as Destiny Jones and the procedure verified as C-Section Delivery. A Time Out was held and the above information confirmed.  After induction of anesthesia by obtaining a spinal, the patient was prepped and draped in the usual sterile manner. A Pfannenstiel skin incision was made and carried down through the subcutaneous tissue to the underlying layer of fascia.  The fascia was incised bilaterally and extended transversely bilaterally with the Mayo scissors. Kocher clamps were placed on the inferior aspect of the fascial incision and the underlying rectus muscle was separated from the fascia. The same was done on the superior aspect of the fascial incision.  The peritoneum was identified, entered bluntly and extended manually.  An Alexis self-retaining retractor was placed.  The utero-vesical peritoneal reflection was incised transversely and the bladder flap was bluntly freed from the lower uterine segment. A low transverse uterine incision was made with the scalpel and extended bilaterally with the bandage scissors.  The infant was delivered in vertex position without difficulty.  After the umbilical cord was clamped and cut, the infant was handed to the awaiting pediatricians.   Cord blood was obtained for evaluation.  The placenta was removed intact and appeared to be within normal limits. The uterus was cleared of all clots and debris. The uterine incision was closed with running interlocking sutures of 0 Vicryl and a second imbricating layer was performed as well.   Bilateral tubes and ovaries appeared to be within normal limits.  Good hemostasis was noted.  Copious irrigation was performed until clear.  The peritoneum was repaired with 2-0 chromic via a running suture.  The fascia was reapproximated with a running suture of 0 Vicryl. The subcutaneous tissue was reapproximated with 3 interrupted sutures of 2-0 plain.  The skin was reapproximated with a subcuticular suture of 3-0 monocryl.  Steristrips were applied.  Pressure dressing applied.  Instrument, sponge, and needle counts were correct prior to abdominal closure and at the conclusion of the case.  The patient was awaiting transfer to the recovery room in good condition.  Findings: Live female infant with Apgars 9 at one minute and 9 at five minutes.  Normal appearing bilateral ovaries and fallopian tubes were noted.  Estimated Blood Loss:  1400 ml         Drains: foley to gravity 50 cc         Total IV Fluids: 4500 ml         Specimens to Pathology: none (Placenta went to L&D)         Complications:  None; patient tolerated the procedure well.         Disposition: PACU - hemodynamically stable.         Condition: stable  Attending Attestation: I performed the procedure.

## 2015-06-03 NOTE — Transfer of Care (Signed)
Immediate Anesthesia Transfer of Care Note  Patient: Destiny Jones  Procedure(s) Performed: Procedure(s): CESAREAN SECTION (N/A)  Patient Location: PACU  Anesthesia Type:Spinal  Level of Consciousness: awake, alert , oriented and patient cooperative  Airway & Oxygen Therapy: Patient Spontanous Breathing  Post-op Assessment: Report given to RN and Post -op Vital signs reviewed and stable  Post vital signs: Reviewed and stable  Last Vitals:  Filed Vitals:   06/03/15 1301  BP: 116/74  Pulse: 87  Temp: 36.7 C  Resp: 18    Complications: No apparent anesthesia complications

## 2015-06-03 NOTE — Anesthesia Procedure Notes (Signed)
Spinal Patient location during procedure: OR Preanesthetic Checklist Completed: patient identified, site marked, surgical consent, pre-op evaluation, timeout performed, IV checked, risks and benefits discussed and monitors and equipment checked Spinal Block Patient position: sitting Prep: DuraPrep Patient monitoring: heart rate, cardiac monitor, continuous pulse ox and blood pressure Approach: midline Location: L3-4 Injection technique: single-shot Needle Needle type: Sprotte  Needle gauge: 24 G Needle length: 9 cm Assessment Sensory level: T3 Additional Notes Spinal Dosage in OR  Bupivicaine ml       1.6 PFMS04   mcg        100 Fentanyl mcg            25

## 2015-06-04 ENCOUNTER — Inpatient Hospital Stay (HOSPITAL_COMMUNITY): Admission: RE | Admit: 2015-06-04 | Payer: Medicaid Other | Source: Ambulatory Visit

## 2015-06-04 ENCOUNTER — Encounter (HOSPITAL_COMMUNITY): Payer: Self-pay | Admitting: Obstetrics and Gynecology

## 2015-06-04 LAB — CBC
HCT: 23.6 % — ABNORMAL LOW (ref 36.0–46.0)
HEMOGLOBIN: 7.6 g/dL — AB (ref 12.0–15.0)
MCH: 23.8 pg — AB (ref 26.0–34.0)
MCHC: 31.8 g/dL (ref 30.0–36.0)
MCV: 74.9 fL — ABNORMAL LOW (ref 78.0–100.0)
Platelets: 160 10*3/uL (ref 150–400)
RBC: 3.15 MIL/uL — AB (ref 3.87–5.11)
RDW: 15.8 % — AB (ref 11.5–15.5)
WBC: 8.6 10*3/uL (ref 4.0–10.5)

## 2015-06-04 LAB — RPR: RPR Ser Ql: NONREACTIVE

## 2015-06-04 MED ORDER — FERROUS SULFATE 325 (65 FE) MG PO TABS
325.0000 mg | ORAL_TABLET | Freq: Two times a day (BID) | ORAL | Status: DC
  Administered 2015-06-04 – 2015-06-06 (×4): 325 mg via ORAL
  Filled 2015-06-04 (×4): qty 1

## 2015-06-04 NOTE — Anesthesia Postprocedure Evaluation (Signed)
  Anesthesia Post-op Note  Patient: Destiny Jones  Procedure(s) Performed: Procedure(s): CESAREAN SECTION (N/A)  Patient Location: Mother/Baby  Anesthesia Type:Spinal  Level of Consciousness: awake  Airway and Oxygen Therapy: Patient Spontanous Breathing  Post-op Pain: mild  Post-op Assessment: Patient's Cardiovascular Status Stable and Respiratory Function Stable LLE Motor Response: Purposeful movement LLE Sensation: Tingling RLE Motor Response: Purposeful movement RLE Sensation: Tingling L Sensory Level: L5-Outer lower leg, top of foot, great toe R Sensory Level: S1-Sole of foot, small toes  Post-op Vital Signs: stable  Last Vitals:  Filed Vitals:   06/04/15 0549  BP: 103/51  Pulse: 72  Temp: 37.2 C  Resp: 16    Complications: No apparent anesthesia complications

## 2015-06-04 NOTE — Progress Notes (Signed)
Subjective: Postpartum Day 1: Cesarean Delivery Patient reports incisional pain, tolerating PO and no problems voiding.    Objective: Vital signs in last 24 hours: Temp:  [97.7 F (36.5 C)-98.9 F (37.2 C)] 98.5 F (36.9 C) (07/26 0911) Pulse Rate:  [64-87] 64 (07/26 0911) Resp:  [12-18] 18 (07/26 0911) BP: (94-126)/(45-73) 126/51 mmHg (07/26 0911) SpO2:  [97 %-100 %] 99 % (07/26 0911) Weight:  [225 lb (102.059 kg)] 225 lb (102.059 kg) (07/25 2030)  Physical Exam:  General: alert and cooperative Lochia: appropriate Uterine Fundus: firm Incision: healing well, no significant drainage DVT Evaluation: No evidence of DVT seen on physical exam.   Recent Labs  06/03/15 1255 06/04/15 0531  HGB 9.5* 7.6*  HCT 29.9* 23.6*    Assessment/Plan: Status post Cesarean section. Doing well postoperatively.  Pt with asymptomatic anemia.start iron supplement  Analeah Brame A 06/04/2015, 1:09 PM

## 2015-06-04 NOTE — Addendum Note (Signed)
Addendum  created 06/04/15 0745 by Renford Dills, CRNA   Modules edited: Notes Section   Notes Section:  File: [id number]

## 2015-06-05 LAB — COMPREHENSIVE METABOLIC PANEL
ALT: 13 U/L — AB (ref 14–54)
ANION GAP: 3 — AB (ref 5–15)
AST: 25 U/L (ref 15–41)
Albumin: 2.9 g/dL — ABNORMAL LOW (ref 3.5–5.0)
Alkaline Phosphatase: 99 U/L (ref 38–126)
BUN: 6 mg/dL (ref 6–20)
CALCIUM: 8.3 mg/dL — AB (ref 8.9–10.3)
CO2: 25 mmol/L (ref 22–32)
CREATININE: 0.69 mg/dL (ref 0.44–1.00)
Chloride: 107 mmol/L (ref 101–111)
GFR calc Af Amer: >60 mL/min (ref >60)
Glucose, Bld: 89 mg/dL (ref 65–99)
Potassium: 3.8 mmol/L (ref 3.5–5.1)
SODIUM: 135 mmol/L (ref 135–145)
TOTAL PROTEIN: 6.3 g/dL — AB (ref 6.5–8.1)
Total Bilirubin: 0.5 mg/dL (ref 0.3–1.2)

## 2015-06-05 LAB — CBC
HEMATOCRIT: 24.6 % — AB (ref 36.0–46.0)
Hemoglobin: 7.8 g/dL — ABNORMAL LOW (ref 12.0–15.0)
MCH: 24.1 pg — ABNORMAL LOW (ref 26.0–34.0)
MCHC: 31.7 g/dL (ref 30.0–36.0)
MCV: 75.9 fL — ABNORMAL LOW (ref 78.0–100.0)
Platelets: 195 10*3/uL (ref 150–400)
RBC: 3.24 MIL/uL — ABNORMAL LOW (ref 3.87–5.11)
RDW: 16.2 % — ABNORMAL HIGH (ref 11.5–15.5)
WBC: 12.6 10*3/uL — ABNORMAL HIGH (ref 4.0–10.5)

## 2015-06-05 LAB — LACTATE DEHYDROGENASE: LDH: 184 U/L (ref 98–192)

## 2015-06-05 LAB — URIC ACID: Uric Acid, Serum: 4.2 mg/dL (ref 2.3–6.6)

## 2015-06-05 MED ORDER — LACTATED RINGERS IV BOLUS (SEPSIS)
500.0000 mL | Freq: Once | INTRAVENOUS | Status: AC
  Administered 2015-06-05: 500 mL via INTRAVENOUS

## 2015-06-05 NOTE — Progress Notes (Signed)
TC from RN--patient still c/o slightly blurry vision, mild HA.  Denies epigastric pain, no syncope or dizziness with ambulation now.  Had noted visual issue around 12:30p.  Filed Vitals:   06/05/15 0517 06/05/15 1223 06/05/15 1739 06/05/15 2009  BP: 108/52 117/59 120/46 130/70  Pulse: 71 76 89 85  Temp: 98.2 F (36.8 C) 98.8 F (37.1 C) 98.7 F (37.1 C)   TempSrc: Oral Oral Oral   Resp: Height:      Weight:      SpO2:   100%    CBC Latest Ref Rng 06/04/2015 06/03/2015 10/18/2014  WBC 4.0 - 10.5 K/uL 8.6 7.9 6.9  Hemoglobin 12.0 - 15.0 g/dL 7.6(L) 9.5(L) 11.6(L)  Hematocrit 36.0 - 46.0 % 23.6(L) 29.9(L) 34.8(L)  Platelets 150 - 400 K/uL 160 167 236   Scopolamine patch removed in shower today around 1pm.  Pain meds received: Tylenol 1000 mg at 1115 Dilaudid 2 mg at 1228 and 1809 Motrin at 1225 and 1809  Received IV bolus 1312  Known anemia, with transfusion declined several times.  No evidence of hypertension.  Will check PIH labs. Recheck BP q 4 hours through night. F/u as needed.  Nigel Bridgeman, CNM 06/05/15 8:55p

## 2015-06-05 NOTE — Progress Notes (Addendum)
Destiny Jones [id number] Postpartum Day 2 S/P Scheduled Primary Cesarean Section due to Fetal Macrosomia  Subjective: Patient up ad lib, denies syncope, but reports some dizziness upon standing. Reports consuming regular diet without issues and denies N/V. Patient reports no bowel movement, but is passing flatus.  Denies issues with urination and reports bleeding is "light then heavy."  Patient is breast and bottlefeeding and reports going well.  Desires micronor for postpartum contraception.  Pain is being appropriately managed with use of motrin, dilaudid, and tylenol.   Objective: Temp:  [98.2 F (36.8 C)-98.9 F (37.2 C)] 98.2 F (36.8 C) (07/27 0517) Pulse Rate:  [68-71] 71 (07/27 0517) Resp:  [18] 18 (07/27 0517) BP: (105-112)/(52-58) 108/52 mmHg (07/27 0517) SpO2:  [99 %] 99 % (07/26 1345)   Recent Labs  06/03/15 1255 06/04/15 0531  HGB 9.5* 7.6*  HCT 29.9* 23.6*  WBC 7.9 8.6    Physical Exam:  General: alert, cooperative and no distress Mood/Affect: Anxious Lungs: clear to auscultation, no wheezes, rales or rhonchi, symmetric air entry.  Heart: normal rate and regular rhythm. Breast: not examined. Abdomen:  + bowel sounds, Appropriately Tender, Mild Distention Incision: Well approximated,no redness, bleeding, appropriate temp. Pressure and honeycomb dressing removed, New steri strips applied.  Wound OTA Uterine Fundus: firm at U/-1 Lochia: appropriate Skin: Warm, Dry. DVT Evaluation: No evidence of DVT seen on physical exam. No significant calf/ankle edema. JP drain:   None  Assessment Post Operative Day 1 S/P Primary C/S Normal Involution Breast & Bottle Feeding Hemodynamically Stable Symptomatic Anemia  Plan: -Discussed usage of blood transfusion to help with feelings of dizziness; Patient declines at current -Educated on wound care -Information regarding long-term birth control methods -Continue other mgmt as ordered -Dr. Sallyanne Havers to be updated on patient  status  Marlene Bast MSN, CNM 06/05/2015, 9:19 AM    I saw and examined patient and agree with above findings, assessment and plan.  Dr. Sallye Ober.

## 2015-06-05 NOTE — Progress Notes (Signed)
Destiny Jones contacted to report patient's blurred vision and headache. PIH labs ordered and instructed to recheck blood pressure in four hours.

## 2015-06-06 MED ORDER — HYDROMORPHONE HCL 2 MG PO TABS: 2.0000 mg | ORAL_TABLET | Freq: Four times a day (QID) | ORAL | Status: DC | PRN

## 2015-06-06 MED ORDER — IBUPROFEN 600 MG PO TABS: 600.0000 mg | ORAL_TABLET | Freq: Four times a day (QID) | ORAL | Status: DC

## 2015-06-06 MED ORDER — FERROUS SULFATE 325 (65 FE) MG PO TABS: 325.0000 mg | ORAL_TABLET | Freq: Two times a day (BID) | ORAL | Status: DC

## 2015-06-06 NOTE — Discharge Summary (Signed)
Cesarean Section Delivery Discharge Summary  Destiny Jones  DOB:    February 02, 1986 MRN:    [id number] CSN:    [id number]  Date of admission:                  06/03/15  Date of discharge:                   06/06/15  Procedures this admission:  CS  Date of Delivery: 06/03/15  Newborn Data:  Live born  Information for the patient's newborn:  Jones, Destiny Nya [[id number]]  female   Live born female  Birth Weight: 9 lb 10.3 oz (4374 g) APGAR: 9, 9  Home with mother. Name: Destiny Jones  History of Present Illness:  Ms. Destiny Jones is a 29 y.o. female, G2P1011, who presents at [redacted]w[redacted]d weeks gestation. The patient has been followed at the [facility] and Gynecology division of [facility].    Her pregnancy has been complicated by:  Patient Active Problem List   Diagnosis Date Noted  . S/P primary low transverse C-section 06/03/2015  . Post-dates pregnancy   . Fetal macrosomia   . [redacted] weeks gestation of pregnancy   . Pediatric pre-birth visit for expectant parent(s) 05/16/2015  . Nausea with vomiting 10/23/2014    Hospital course: The patient was admitted for CS for macrosomia.   Her postpartum course was not complicated.  She was discharged to home on postpartum day 3 doing well.  Feeding: breast  Contraception: unsure   Discharge hemoglobin: HEMOGLOBIN  Date Value Ref Range Status  06/05/2015 7.8* 12.0 - 15.0 g/dL Final   HCT  Date Value Ref Range Status  06/05/2015 24.6* 36.0 - 46.0 % Final   Anemia - hemodynamicly stable.  Declined transfusion  PreNatal Labs ABO, Rh: --/--/O POS, O POS (07/25 1255)   Antibody: NEG (07/25 1255) Rubella:    immune RPR: Non Reactive (07/25 1255)  HBsAg: Negative (12/08 0000)  HIV: Non-reactive (12/08 0000)  GBS: Negative (06/24 0000)  Discharge Physical Exam:  General: alert and cooperative Lochia: appropriate Uterine Fundus: firm Incision: healing well DVT Evaluation: No evidence of DVT seen  on physical exam.  Intrapartum Procedures: cesarean: low cervical, transverse Postpartum Procedures: none Complications-Operative and Postpartum: none  Discharge Diagnoses: Post-date pregnancy,  anemia  Discharge Information:  Activity:           pelvic rest Diet:                routine Medications: PNV, Ibuprofen and Iron Condition:      stable   Discharge to: home     Destiny Jones, CNM, MSN  06/06/2015. 9:18 AM  Care After Cesarean Delivery  Refer to this sheet in the next few weeks. These instructions provide you with information on caring for yourself after your procedure. Your caregiver may also give you specific instructions. Your treatment has been planned according to current medical practices, but problems sometimes occur. Call your caregiver if you have any problems or questions after you go home. HOME CARE INSTRUCTIONS  Only take over-the-counter or prescription medicines as directed by your caregiver.  Do not drink alcohol, especially if you are breastfeeding or taking medicine to relieve pain.  Do not chew or smoke tobacco.  Continue to use good perineal care. Good perineal care includes:  Wiping your perineum from front to back.  Keeping your perineum clean.  Check your cut (incision) daily for increased redness, drainage, swelling, or separation of skin.  Clean your incision gently with soap and water every day, and then pat it dry. If your caregiver says it is okay, leave the incision uncovered. Use a bandage (dressing) if the incision is draining fluid or appears irritated. If the adhesive strips across the incision do not fall off within 7 days, carefully peel them off.  Hug a pillow when coughing or sneezing until your incision is healed. This helps to relieve pain.  Do not use tampons or douche until your caregiver says it is okay.  Shower, wash your hair, and take tub baths as directed by your caregiver.  Wear a well-fitting bra that provides  breast support.  Limit wearing support panties or control-top hose.  Drink enough fluids to keep your urine clear or pale yellow.  Eat high-fiber foods such as whole grain cereals and breads, brown rice, beans, and fresh fruits and vegetables every day. These foods may help prevent or relieve constipation.  Resume activities such as climbing stairs, driving, lifting, exercising, or traveling as directed by your caregiver.  Talk to your caregiver about resuming sexual activities. This is dependent upon your risk of infection, your rate of healing, and your comfort and desire to resume sexual activity.  Try to have someone help you with your household activities and your newborn for at least a few days after you leave the hospital.  Rest as much as possible. Try to rest or take a nap when your newborn is sleeping.  Increase your activities gradually.  Keep all of your scheduled postpartum appointments. It is very important to keep your scheduled follow-up appointments. At these appointments, your caregiver will be checking to make sure that you are healing physically and emotionally. SEEK MEDICAL CARE IF:   You are passing large clots from your vagina. Save any clots to show your caregiver.  You have a foul smelling discharge from your vagina.  You have trouble urinating.  You are urinating frequently.  You have pain when you urinate.  You have a change in your bowel movements.  You have increasing redness, pain, or swelling near your incision.  You have pus draining from your incision.  Your incision is separating.  You have painful, hard, or reddened breasts.  You have a severe headache.  You have blurred vision or see spots.  You feel sad or depressed.  You have thoughts of hurting yourself or your newborn.  You have questions about your care, the care of your newborn, or medicines.  You are dizzy or lightheaded.  You have a rash.  You have pain, redness, or  swelling at the site of the removed intravenous access (IV) tube.  You have nausea or vomiting.  You stopped breastfeeding and have not had a menstrual period within 12 weeks of stopping.  You are not breastfeeding and have not had a menstrual period within 12 weeks of delivery.  You have a fever. SEEK IMMEDIATE MEDICAL CARE IF:  You have persistent pain.  You have chest pain.  You have shortness of breath.  You faint.  You have leg pain.  You have stomach pain.  Your vaginal bleeding saturates 2 or more sanitary pads in 1 hour. MAKE SURE YOU:   Understand these instructions.  Will watch your condition.  Will get help right away if you are not doing well or get worse. Document Released: 07/18/2002 Document Revised: 07/20/2012 Document Reviewed: 06/22/2012 [facility] Patient Information 2014 [place], [place].   Postpartum Depression and Baby Blues  The postpartum period  begins right after the birth of a baby. During this time, there is often a great amount of joy and excitement. It is also a time of considerable changes in the life of the parent(s). Regardless of how many times a mother gives birth, each child brings new challenges and dynamics to the family. It is not unusual to have feelings of excitement accompanied by confusing shifts in moods, emotions, and thoughts. All mothers are at risk of developing postpartum depression or the "baby blues." These mood changes can occur right after giving birth, or they may occur many months after giving birth. The baby blues or postpartum depression can be mild or severe. Additionally, postpartum depression can resolve rather quickly, or it can be a long-term condition. CAUSES Elevated hormones and their rapid decline are thought to be a main cause of postpartum depression and the baby blues. There are a number of hormones that radically change during and after pregnancy. Estrogen and progesterone usually decrease immediately after  delivering your baby. The level of thyroid hormone and various cortisol steroids also rapidly drop. Other factors that play a major role in these changes include major life events and genetics.  RISK FACTORS If you have any of the following risks for the baby blues or postpartum depression, know what symptoms to watch out for during the postpartum period. Risk factors that may increase the likelihood of getting the baby blues or postpartum depression include: 1. Havinga personal or family history of depression. 2. Having depression while being pregnant. 3. Having premenstrual or oral contraceptive-associated mood issues. 4. Having exceptional life stress. 5. Having marital conflict. 6. Lacking a social support network. 7. Having a baby with special needs. 8. Having health problems such as diabetes. SYMPTOMS Baby blues symptoms include:  Brief fluctuations in mood, such as going from extreme happiness to sadness.  Decreased concentration.  Difficulty sleeping.  Crying spells, tearfulness.  Irritability.  Anxiety. Postpartum depression symptoms typically begin within the first month after giving birth. These symptoms include:  Difficulty sleeping or excessive sleepiness.  Marked weight loss.  Agitation.  Feelings of worthlessness.  Lack of interest in activity or food. Postpartum psychosis is a very concerning condition and can be dangerous. Fortunately, it is rare. Displaying any of the following symptoms is cause for immediate medical attention. Postpartum psychosis symptoms include:  Hallucinations and delusions.  Bizarre or disorganized behavior.  Confusion or disorientation. DIAGNOSIS  A diagnosis is made by an evaluation of your symptoms. There are no medical or lab tests that lead to a diagnosis, but there are various questionnaires that a caregiver may use to identify those with the baby blues, postpartum depression, or psychosis. Often times, a screening tool  called the [place] Postnatal Depression Jones is used to diagnose depression in the postpartum period.  TREATMENT The baby blues usually goes away on its own in 1 to 2 weeks. Social support is often all that is needed. You should be encouraged to get adequate sleep and rest. Occasionally, you may be given medicines to help you sleep.  Postpartum depression requires treatment as it can last several months or longer if it is not treated. Treatment may include individual or group therapy, medicine, or both to address any social, physiological, and psychological factors that may play a role in the depression. Regular exercise, a healthy diet, rest, and social support may also be strongly recommended.  Postpartum psychosis is more serious and needs treatment right away. Hospitalization is often needed. HOME CARE INSTRUCTIONS  Get  as much rest as you can. Nap when the baby sleeps.  Exercise regularly. Some women find yoga and walking to be beneficial.  Eat a balanced and nourishing diet.  Do little things that you enjoy. Have a cup of tea, take a bubble bath, read your favorite magazine, or listen to your favorite music.  Avoid alcohol.  Ask for help with household chores, cooking, grocery shopping, or running errands as needed. Do not try to do everything.  Talk to people close to you about how you are feeling. Get support from your partner, family members, friends, or other new moms.  Try to stay positive in how you think. Think about the things you are grateful for.  Do not spend a lot of time alone.  Only take medicine as directed by your caregiver.  Keep all your postpartum appointments.  Let your caregiver know if you have any concerns. SEEK MEDICAL CARE IF: You are having a reaction or problems with your medicine. SEEK IMMEDIATE MEDICAL CARE IF:  You have suicidal feelings.  You feel you may harm the baby or someone else. Document Released: 07/30/2004 Document Revised:  01/18/2012 Document Reviewed: 09/01/2011 [facility] Patient Information 2014 [place], [place].   Breastfeeding Deciding to breastfeed is one of the best choices you can make for you and your baby. A change in hormones during pregnancy causes your breast tissue to grow and increases the number and size of your milk ducts. These hormones also allow proteins, sugars, and fats from your blood supply to make breast milk in your milk-producing glands. Hormones prevent breast milk from being released before your baby is born as well as prompt milk flow after birth. Once breastfeeding has begun, thoughts of your baby, as well as his or her sucking or crying, can stimulate the release of milk from your milk-producing glands.  BENEFITS OF BREASTFEEDING For Your Baby  Your first milk (colostrum) helps your baby's digestive system function better.   There are antibodies in your milk that help your baby fight off infections.   Your baby has a lower incidence of asthma, allergies, and sudden infant death syndrome.   The nutrients in breast milk are better for your baby than infant formulas and are designed uniquely for your baby's needs.   Breast milk improves your baby's brain development.   Your baby is less likely to develop other conditions, such as childhood obesity, asthma, or type 2 diabetes mellitus.  For You   Breastfeeding helps to create a very special bond between you and your baby.   Breastfeeding is convenient. Breast milk is always available at the correct temperature and costs nothing.   Breastfeeding helps to burn calories and helps you lose the weight gained during pregnancy.   Breastfeeding makes your uterus contract to its prepregnancy size faster and slows bleeding (lochia) after you give birth.   Breastfeeding helps to lower your risk of developing type 2 diabetes mellitus, osteoporosis, and breast or ovarian cancer later in life. SIGNS THAT YOUR BABY IS HUNGRY Early  Signs of Hunger  Increased alertness or activity.  Stretching.  Movement of the head from side to side.  Movement of the head and opening of the mouth when the corner of the mouth or cheek is stroked (rooting).  Increased sucking sounds, smacking lips, cooing, sighing, or squeaking.  Hand-to-mouth movements.  Increased sucking of fingers or hands. Late Signs of Hunger  Fussing.  Intermittent crying. Extreme Signs of Hunger Signs of extreme hunger will require calming  and consoling before your baby will be able to breastfeed successfully. Do not wait for the following signs of extreme hunger to occur before you initiate breastfeeding:   Restlessness.  A loud, Vincent cry.   Screaming.  BREASTFEEDING BASICS Breastfeeding Initiation  Find a comfortable place to sit or lie down, with your neck and back well supported.  Place a pillow or rolled up blanket under your baby to bring him or her to the level of your breast (if you are seated). Nursing pillows are specially designed to help support your arms and your baby while you breastfeed.  Make sure that your baby's abdomen is facing your abdomen.   Gently massage your breast. With your fingertips, massage from your chest wall toward your nipple in a circular motion. This encourages milk flow. You may need to continue this action during the feeding if your milk flows slowly.  Support your breast with 4 fingers underneath and your thumb above your nipple. Make sure your fingers are well away from your nipple and your baby's mouth.   Stroke your baby's lips gently with your finger or nipple.   When your baby's mouth is open wide enough, quickly bring your baby to your breast, placing your entire nipple and as much of the colored area around your nipple (areola) as possible into your baby's mouth.   More areola should be visible above your baby's upper lip than below the lower lip.   Your baby's tongue should be between  his or her lower gum and your breast.   Ensure that your baby's mouth is correctly positioned around your nipple (latched). Your baby's lips should create a seal on your breast and be turned out (everted).  It is common for your baby to suck about 2-3 minutes in order to start the flow of breast milk. Latching Teaching your baby how to latch on to your breast properly is very important. An improper latch can cause nipple pain and decreased milk supply for you and poor weight gain in your baby. Also, if your baby is not latched onto your nipple properly, he or she may swallow some air during feeding. This can make your baby fussy. Burping your baby when you switch breasts during the feeding can help to get rid of the air. However, teaching your baby to latch on properly is still the best way to prevent fussiness from swallowing air while breastfeeding. Signs that your baby has successfully latched on to your nipple:    Silent tugging or silent sucking, without causing you pain.   Swallowing heard between every 3-4 sucks.    Muscle movement above and in front of his or her ears while sucking.  Signs that your baby has not successfully latched on to nipple:   Sucking sounds or smacking sounds from your baby while breastfeeding.  Nipple pain. If you think your baby has not latched on correctly, slip your finger into the corner of your baby's mouth to break the suction and place it between your baby's gums. Attempt breastfeeding initiation again. Signs of Successful Breastfeeding Signs from your baby:   A gradual decrease in the number of sucks or complete cessation of sucking.   Falling asleep.   Relaxation of his or her body.   Retention of a small amount of milk in his or her mouth.   Letting go of your breast by himself or herself. Signs from you:  Breasts that have increased in firmness, weight, and size 1-3 hours after  feeding.   Breasts that are softer immediately  after breastfeeding.  Increased milk volume, as well as a change in milk consistency and color by the fifth day of breastfeeding.   Nipples that are not sore, cracked, or bleeding. Signs That Your Pecola Leisure is Getting Enough Milk  Wetting at least 3 diapers in a 24-hour period. The urine should be clear and pale yellow by age 349 days.  At least 3 stools in a 24-hour period by age 349 days. The stool should be soft and yellow.  At least 3 stools in a 24-hour period by age 256 days. The stool should be seedy and yellow.  No loss of weight greater than 10% of birth weight during the first 54 days of age.  Average weight gain of 4-7 ounces (113-198 g) per week after age 25 days.  Consistent daily weight gain by age 349 days, without weight loss after the age of 2 weeks. After a feeding, your baby may spit up a small amount. This is common. BREASTFEEDING FREQUENCY AND DURATION Frequent feeding will help you make more milk and can prevent sore nipples and breast engorgement. Breastfeed when you feel the need to reduce the fullness of your breasts or when your baby shows signs of hunger. This is called "breastfeeding on demand." Avoid introducing a pacifier to your baby while you are working to establish breastfeeding (the first 4-6 weeks after your baby is born). After this time you may choose to use a pacifier. Research has shown that pacifier use during the first year of a baby's life decreases the risk of sudden infant death syndrome (SIDS). Allow your baby to feed on each breast as long as he or she wants. Breastfeed until your baby is finished feeding. When your baby unlatches or falls asleep while feeding from the first breast, offer the second breast. Because newborns are often sleepy in the first few weeks of life, you may need to awaken your baby to get him or her to feed. Breastfeeding times will vary from baby to baby. However, the following rules can serve as a guide to help you ensure that your baby  is properly fed:  Newborns (babies 42 weeks of age or younger) may breastfeed every 1-3 hours.  Newborns should not go longer than 3 hours during the day or 5 hours during the night without breastfeeding.  You should breastfeed your baby a minimum of 8 times in a 24-hour period until you begin to introduce solid foods to your baby at around 25 months of age. BREAST MILK PUMPING Pumping and storing breast milk allows you to ensure that your baby is exclusively fed your breast milk, even at times when you are unable to breastfeed. This is especially important if you are going back to work while you are still breastfeeding or when you are not able to be present during feedings. Your lactation consultant can give you guidelines on how long it is safe to store breast milk.  A breast pump is a machine that allows you to pump milk from your breast into a sterile bottle. The pumped breast milk can then be stored in a refrigerator or freezer. Some breast pumps are operated by hand, while others use electricity. Ask your lactation consultant which type will work best for you. Breast pumps can be purchased, but some hospitals and breastfeeding support groups lease breast pumps on a monthly basis. A lactation consultant can teach you how to hand express breast milk, if you prefer  not to use a pump.  CARING FOR YOUR BREASTS WHILE YOU BREASTFEED Nipples can become dry, cracked, and sore while breastfeeding. The following recommendations can help keep your breasts moisturized and healthy:  Avoid using soap on your nipples.   Wear a supportive bra. Although not required, special nursing bras and tank tops are designed to allow access to your breasts for breastfeeding without taking off your entire bra or top. Avoid wearing underwire-style bras or extremely tight bras.  Air dry your nipples for 3-71minutes after each feeding.   Use only cotton bra pads to absorb leaked breast milk. Leaking of breast milk between  feedings is normal.   Use lanolin on your nipples after breastfeeding. Lanolin helps to maintain your skin's normal moisture barrier. If you use pure lanolin, you do not need to wash it off before feeding your baby again. Pure lanolin is not toxic to your baby. You may also hand express a few drops of breast milk and gently massage that milk into your nipples and allow the milk to air dry. In the first few weeks after giving birth, some women experience extremely full breasts (engorgement). Engorgement can make your breasts feel heavy, warm, and tender to the touch. Engorgement peaks within 3-5 days after you give birth. The following recommendations can help ease engorgement:  Completely empty your breasts while breastfeeding or pumping. You may want to start by applying warm, moist heat (in the shower or with warm water-soaked hand towels) just before feeding or pumping. This increases circulation and helps the milk flow. If your baby does not completely empty your breasts while breastfeeding, pump any extra milk after he or she is finished.  Wear a snug bra (nursing or regular) or tank top for 1-2 days to signal your body to slightly decrease milk production.  Apply ice packs to your breasts, unless this is too uncomfortable for you.  Make sure that your baby is latched on and positioned properly while breastfeeding. If engorgement persists after 48 hours of following these recommendations, contact your health care provider or a Advertising copywriter. OVERALL HEALTH CARE RECOMMENDATIONS WHILE BREASTFEEDING  Eat healthy foods. Alternate between meals and snacks, eating 3 of each per day. Because what you eat affects your breast milk, some of the foods may make your baby more irritable than usual. Avoid eating these foods if you are sure that they are negatively affecting your baby.  Drink milk, fruit juice, and water to satisfy your thirst (about 10 glasses a day).   Rest often, relax, and  continue to take your prenatal vitamins to prevent fatigue, stress, and anemia.  Continue breast self-awareness checks.  Avoid chewing and smoking tobacco.  Avoid alcohol and drug use. Some medicines that may be harmful to your baby can pass through breast milk. It is important to ask your health care provider before taking any medicine, including all over-the-counter and prescription medicine as well as vitamin and herbal supplements. It is possible to become pregnant while breastfeeding. If birth control is desired, ask your health care provider about options that will be safe for your baby. SEEK MEDICAL CARE IF:   You feel like you want to stop breastfeeding or have become frustrated with breastfeeding.  You have painful breasts or nipples.  Your nipples are cracked or bleeding.  Your breasts are red, tender, or warm.  You have a swollen area on either breast.  You have a fever or chills.  You have nausea or vomiting.  You have  drainage other than breast milk from your nipples.  Your breasts do not become full before feedings by the fifth day after you give birth.  You feel sad and depressed.  Your baby is too sleepy to eat well.  Your baby is having trouble sleeping.   Your baby is wetting less than 3 diapers in a 24-hour period.  Your baby has less than 3 stools in a 24-hour period.  Your baby's skin or the white part of his or her eyes becomes yellow.   Your baby is not gaining weight by 55 days of age. SEEK IMMEDIATE MEDICAL CARE IF:   Your baby is overly tired (lethargic) and does not want to wake up and feed.  Your baby develops an unexplained fever. Document Released: 10/26/2005 Document Revised: 10/31/2013 Document Reviewed: 04/19/2013 [facility] Patient Information 2015 [place], [place]. This information is not intended to replace advice given to you by your health care provider. Make sure you discuss any questions you have with your health care provider.

## 2015-06-06 NOTE — Progress Notes (Addendum)
Subjective: Postpartum Day 2: Cesarean Delivery due to suspected macrosomia Patient up ad lib, reports the blurry vision is better than yesterday but "room spinning when I stand up" Benefits of a transfusion reviewed - pt declined Feeding:  breast Contraceptive plan:  Unsure   Objective: Vital signs in last 24 hours: Temp:  [98.7 F (37.1 C)-98.8 F (37.1 C)] 98.7 F (37.1 C) (07/28 0610) Pulse Rate:  [69-89] 69 (07/28 0610) Resp:  [18-20] 18 (07/28 0610) BP: (108-130)/(46-70) 117/56 mmHg (07/28 0610) SpO2:  [100 %] 100 % (07/27 1739)  Physical Exam:  General: alert and cooperative Lochia: appropriate Uterine Fundus: firm Abdomen:  + bowel sounds, non distended Incision: no significant drainage  Honeycomb dressing CDI DVT Evaluation: No evidence of DVT seen on physical exam. Homan's sign: Negative   Recent Labs  06/03/15 1255 06/04/15 0531 06/05/15 2123  HGB 9.5* 7.6* 7.8*  HCT 29.9* 23.6* 24.6*  WBC 7.9 8.6 12.6*   Orthostatics stable.stable  Assessment: Status post Cesarean section day 2. Doing well postoperatively.  Steri strips in place, no significant drainage Anemia - hemodynamicly stable.  Declines transfusion  Plan: Continue current care. Plan for discharge tomorrow, Breastfeeding and Lactation consult Remove outer dressing    Sorayah Schrodt, CNM, MSN 06/06/2015. 9:11 AM

## 2015-06-06 NOTE — Lactation Note (Addendum)
This note was copied from the chart of Destiny Keilynn Marano. Lactation Consultation Note  Upon entering the room, mother giving baby formula.  Mother states she has no problems with breastfeeding but her Peds MD told her to breastfeed baby for 10 minutes and then give her formula. Discussed supply and demand and the importance of establishing her milk supply. Suggest she breastfeed on both breasts burping in between sides. Told mother to call if she decided she wants to breastfeed and would like assistance with latching. She feels she has no milk.  Provided education.  Reviewed hand expression. Reviewed engorgement care.  Mom encouraged to feed baby 8-12 times/24 hours and with feeding cues.    Patient Name: Destiny Jones [id number] Date: 06/06/2015     Maternal Data    Feeding Feeding Type: Formula Nipple Type: Slow - flow  LATCH Score/Interventions                      Lactation Tools Discussed/Used     Consult Status      Hardie Pulley 06/06/2015, 10:50 AM

## 2015-06-07 ENCOUNTER — Inpatient Hospital Stay (HOSPITAL_COMMUNITY): Admission: RE | Admit: 2015-06-07 | Payer: Medicaid Other | Source: Ambulatory Visit

## 2015-07-29 ENCOUNTER — Emergency Department (HOSPITAL_COMMUNITY)
Admission: EM | Admit: 2015-07-29 | Discharge: 2015-07-30 | Disposition: A | Discharge status: Home / Self Care | Payer: Medicaid Other | Attending: Emergency Medicine | Admitting: Emergency Medicine

## 2015-07-29 ENCOUNTER — Encounter (HOSPITAL_COMMUNITY): Payer: Self-pay | Admitting: Emergency Medicine

## 2015-07-29 DIAGNOSIS — N938 Other specified abnormal uterine and vaginal bleeding: Secondary | ICD-10-CM | POA: Insufficient documentation

## 2015-07-29 DIAGNOSIS — Z3202 Encounter for pregnancy test, result negative: Secondary | ICD-10-CM | POA: Diagnosis not present

## 2015-07-29 DIAGNOSIS — Z8619 Personal history of other infectious and parasitic diseases: Secondary | ICD-10-CM | POA: Insufficient documentation

## 2015-07-29 DIAGNOSIS — Z8719 Personal history of other diseases of the digestive system: Secondary | ICD-10-CM | POA: Diagnosis not present

## 2015-07-29 DIAGNOSIS — N939 Abnormal uterine and vaginal bleeding, unspecified: Secondary | ICD-10-CM

## 2015-07-29 DIAGNOSIS — D649 Anemia, unspecified: Secondary | ICD-10-CM | POA: Insufficient documentation

## 2015-07-29 DIAGNOSIS — J029 Acute pharyngitis, unspecified: Secondary | ICD-10-CM | POA: Insufficient documentation

## 2015-07-29 DIAGNOSIS — N920 Excessive and frequent menstruation with regular cycle: Secondary | ICD-10-CM | POA: Diagnosis present

## 2015-07-29 LAB — COMPREHENSIVE METABOLIC PANEL
ALK PHOS: 53 U/L (ref 38–126)
ALT: 12 U/L — AB (ref 14–54)
AST: 19 U/L (ref 15–41)
Albumin: 3.8 g/dL (ref 3.5–5.0)
Anion gap: 6 (ref 5–15)
BILIRUBIN TOTAL: 0.4 mg/dL (ref 0.3–1.2)
CALCIUM: 9.3 mg/dL (ref 8.9–10.3)
CHLORIDE: 107 mmol/L (ref 101–111)
CO2: 25 mmol/L (ref 22–32)
CREATININE: 0.98 mg/dL (ref 0.44–1.00)
Glucose, Bld: 104 mg/dL — ABNORMAL HIGH (ref 65–99)
Potassium: 3.4 mmol/L — ABNORMAL LOW (ref 3.5–5.1)
Sodium: 138 mmol/L (ref 135–145)
Total Protein: 6.9 g/dL (ref 6.5–8.1)

## 2015-07-29 LAB — CBC WITH DIFFERENTIAL/PLATELET
BASOS ABS: 0 10*3/uL (ref 0.0–0.1)
Basophils Relative: 0 %
EOS ABS: 0.2 10*3/uL (ref 0.0–0.7)
EOS PCT: 2 %
HCT: 34.1 % — ABNORMAL LOW (ref 36.0–46.0)
Hemoglobin: 10.8 g/dL — ABNORMAL LOW (ref 12.0–15.0)
LYMPHS ABS: 1.6 10*3/uL (ref 0.7–4.0)
Lymphocytes Relative: 24 %
MCH: 24 pg — AB (ref 26.0–34.0)
MCHC: 31.7 g/dL (ref 30.0–36.0)
MCV: 75.8 fL — AB (ref 78.0–100.0)
Monocytes Absolute: 0.5 10*3/uL (ref 0.1–1.0)
Monocytes Relative: 8 %
Neutro Abs: 4.4 10*3/uL (ref 1.7–7.7)
Neutrophils Relative %: 66 %
PLATELETS: 231 10*3/uL (ref 150–400)
RBC: 4.5 MIL/uL (ref 3.87–5.11)
RDW: 14.6 % (ref 11.5–15.5)
WBC: 6.8 10*3/uL (ref 4.0–10.5)

## 2015-07-29 LAB — POC URINE PREG, ED: PREG TEST UR: NEGATIVE

## 2015-07-29 NOTE — ED Notes (Addendum)
Dr. Blinda Leatherwood with resident at bedside.

## 2015-07-29 NOTE — ED Notes (Signed)
Lab at the bedside 

## 2015-07-29 NOTE — ED Provider Notes (Addendum)
CSN: [id number]     Arrival date & time 07/29/15  1856 History   First MD Initiated Contact with Patient 07/29/15 2223     Chief Complaint  Patient presents with  . Menorrhagia    The patient says her periods are not regular.  The patient said she just had a C-section on June 03, 2015 and she is still bleeding.  She said it stopped for four days but she started bleeding again and is soaking through a pad every .     (Consider location/radiation/quality/duration/timing/severity/associated sxs/prior Treatment) HPI Comments: Patient presents to the ER for heavy vaginal bleeding. Patient reports that she had C-section on July 25. She had postoperative bleeding that eventually stopped and then she had several days of no bleeding. She then started having bleeding again and now is experiencing very heavy bleeding. She reports that she has been going through a pad every 30 minutes this evening. She is experiencing cramping in the lower abdomen and pelvis.  Patient also has been experiencing nasal congestion, sore throat, cough for the last several days. She has been experiencing a headache and has developed a fever.   Past Medical History  Diagnosis Date  . Miscarriage   . Anemia   . GERD (gastroesophageal reflux disease)   . Hx of gonorrhea   . Hx of chlamydia infection   . Thalassemia trait   . Blount's disease   . Hx of varicella    Past Surgical History  Procedure Laterality Date  . Tonsillectomy    . Leg surgery      for Blount's?disease (bow leg)  . Cesarean section N/A 06/03/2015    Procedure: CESAREAN SECTION;  Surgeon: Osborn Coho, MD;  Location: [facility];  Service: Obstetrics;  Laterality: N/A;   History reviewed. No pertinent family history. Social History  Substance Use Topics  . Smoking status: Never Smoker   . Smokeless tobacco: None  . Alcohol Use: Yes   OB History    Gravida Para Term Preterm AB TAB SAB Ectopic Multiple Living   0 1      Review of Systems  HENT: Positive for congestion and sore throat.   Respiratory: Positive for cough. Negative for shortness of breath.   Genitourinary: Positive for vaginal bleeding and pelvic pain.  All other systems reviewed and are negative.     Allergies  Oxycodone and Shellfish allergy  Home Medications   Prior to Admission medications   Medication Sig Start Date End Date Taking? Authorizing Provider  ferrous sulfate 325 (65 FE) MG tablet Take 1 tablet (325 mg total) by mouth 2 (two) times daily with a meal. 06/06/15   Venus Standard, CNM  HYDROmorphone (DILAUDID) 2 MG tablet Take 1 tablet (2 mg total) by mouth every 6 (six) hours as needed for severe pain. 06/06/15   Venus Standard, CNM  ibuprofen (ADVIL,MOTRIN) 600 MG tablet Take 1 tablet (600 mg total) by mouth every 6 (six) hours. 06/06/15   Venus Standard, CNM  Prenatal Vit-Fe Fumarate-FA (PRENATAL MULTIVITAMIN) TABS tablet Take 1 tablet by mouth daily at 12 noon.    Historical Provider, MD   BP 146/84 mmHg  Pulse 102  Temp(Src) 100.7 F (38.2 C) (Oral)  Resp 18  Ht  (1.676 m)  Wt 204 lb (92.534 kg)  BMI 32.94 kg/m2  SpO2 100% Physical Exam  Constitutional: She is oriented to person, place, and time. She appears well-developed and well-nourished. No distress.  HENT:  Head: Normocephalic and atraumatic.  Right Ear: Hearing normal.  Left Ear: Hearing normal.  Nose: Mucosal edema present.  Mouth/Throat: Oropharynx is clear and moist and mucous membranes are normal.  Eyes: Conjunctivae and EOM are normal. Pupils are equal, round, and reactive to light.  Neck: Normal range of motion. Neck supple.  Cardiovascular: Regular rhythm, S1 normal and S2 normal.  Exam reveals no gallop and no friction rub.   No murmur heard. Pulmonary/Chest: Effort normal and breath sounds normal. No respiratory distress. She exhibits no tenderness.  Abdominal: Soft. Normal appearance and bowel sounds are normal. There is no  hepatosplenomegaly. There is no tenderness. There is no rebound, no guarding, no tenderness at McBurney's point and negative Murphy's sign. No hernia.  Genitourinary: Uterus is tender. Right adnexum displays no mass and no tenderness. Left adnexum displays no mass and no tenderness.  Bleeding from cervix with vaginal vault filled with blood, no clots  Musculoskeletal: Normal range of motion.  Neurological: She is alert and oriented to person, place, and time. She has normal strength. No cranial nerve deficit or sensory deficit. Coordination normal. GCS eye subscore is 4. GCS verbal subscore is 5. GCS motor subscore is 6.  Skin: Skin is warm, dry and intact. No rash noted. No cyanosis.  Psychiatric: She has a normal mood and affect. Her speech is normal and behavior is normal. Thought content normal.  Nursing note and vitals reviewed.   ED Course  Procedures (including critical care time) Labs Review Labs Reviewed  CBC WITH DIFFERENTIAL/PLATELET - Abnormal; Notable for the following:    Hemoglobin 10.8 (*)    HCT 34.1 (*)    MCV 75.8 (*)    MCH 24.0 (*)    All other components within normal limits  COMPREHENSIVE METABOLIC PANEL - Abnormal; Notable for the following:    Potassium 3.4 (*)    Glucose, Bld 104 (*)    BUN <5 (*)    ALT 12 (*)    All other components within normal limits  POC URINE PREG, ED    Imaging Review No results found. I have personally reviewed and evaluated these images and lab results as part of my medical decision-making.   EKG Interpretation None      MDM   Final diagnoses:  None   abnormal uterine bleeding Upper respiratory infection  Patient presents to the ER for evaluation of abnormal uterine bleeding. Patient had C-section on July 25. She had a normal recovery, but recently started having heavy bleeding. The bleeding started around the time when she would have likely restarted her menstrual cycle. The family, however, has become very heavy. She  has been going through a pad every 30 minutes tonight. She is not expressing any weakness, dizziness, heart palpitations, chest pain, passing out.  Patient's blood work reveals stable hemoglobin. She had hemoglobin at arrival and repeated 4 hours later without significant change. This was discussed with Dr. Charlotta Newton, covering for the patient's OB/GYN. The fact that the patient has a fever was also discussed. Patient has concomitant upper respiratory infection symptoms and it is felt that this is the cause of the fever. Dr. Charlotta Newton did not feel that the patient would require ultrasound or imaging to further evaluate for endometritis. No antibiotic recommended. She did recommend initiating oral estrogen for a period of several days. She recommends following up at [facility] this week for further extractions.  I did inform the patient that if she has increased bleeding [facility] I also informed her that there is increased risk for clotting on estrogen. Patient was told that she needs to come back to this ER immediately if she develops chest pain or shortness of breath. Dosing of yesterday was discussed with pharmacy. He was recommended to place the patient on 2.5 mg 4 times daily.   Gilda Crease, MD 07/30/15 4010  Gilda Crease, MD 07/30/15 [id number]

## 2015-07-29 NOTE — ED Notes (Signed)
The patient says her periods are not regular.  The patient said she just had a C-section on June 03, 2015 and she is still bleeding.  She said it stopped for four days but she started bleeding again and is soaking through a pad every .  She did say she is cramping on and off and rates the pain 8/10.  She also says she is feeling weak and fatigued.

## 2015-07-29 NOTE — ED Notes (Signed)
Pelvic cart @ bedside.  

## 2015-07-30 LAB — CBC
HCT: 32.7 % — ABNORMAL LOW (ref 36.0–46.0)
HEMOGLOBIN: 10.3 g/dL — AB (ref 12.0–15.0)
MCH: 23.6 pg — AB (ref 26.0–34.0)
MCHC: 31.5 g/dL (ref 30.0–36.0)
MCV: 75 fL — ABNORMAL LOW (ref 78.0–100.0)
Platelets: 234 10*3/uL (ref 150–400)
RBC: 4.36 MIL/uL (ref 3.87–5.11)
RDW: 14.5 % (ref 11.5–15.5)
WBC: 6.7 10*3/uL (ref 4.0–10.5)

## 2015-07-30 MED ORDER — ESTROGENS CONJUGATED 1.25 MG PO TABS
2.5000 mg | ORAL_TABLET | Freq: Once | ORAL | Status: AC
  Administered 2015-07-30: 2.5 mg via ORAL
  Filled 2015-07-30: qty 2

## 2015-07-30 MED ORDER — ESTROGENS CONJUGATED 1.25 MG PO TABS: 2.5000 mg | ORAL_TABLET | Freq: Four times a day (QID) | ORAL | Status: DC

## 2015-07-30 MED ORDER — PROMETHAZINE HCL 25 MG PO TABS: 25.0000 mg | ORAL_TABLET | Freq: Four times a day (QID) | ORAL | Status: DC | PRN

## 2015-07-30 NOTE — Discharge Instructions (Signed)
Abnormal Uterine Bleeding Abnormal uterine bleeding can affect women at various stages in life, including teenagers, women in their reproductive years, pregnant women, and women who have reached menopause. Several kinds of uterine bleeding are considered abnormal, including:  Bleeding or spotting between periods.   Bleeding after sexual intercourse.   Bleeding that is heavier or more than normal.   Periods that last longer than usual.  Bleeding after menopause.  Many cases of abnormal uterine bleeding are minor and simple to treat, while others are more serious. Any type of abnormal bleeding should be evaluated by your health care provider. Treatment will depend on the cause of the bleeding. HOME CARE INSTRUCTIONS Monitor your condition for any changes. The following actions may help to alleviate any discomfort you are experiencing:  Avoid the use of tampons and douches as directed by your health care provider.  Change your pads frequently. You should get regular pelvic exams and Pap tests. Keep all follow-up appointments for diagnostic tests as directed by your health care provider.  SEEK MEDICAL CARE IF:   Your bleeding lasts more than 1 week.   You feel dizzy at times.  SEEK IMMEDIATE MEDICAL CARE IF:   You pass out.   You are changing pads every 15 to 30 minutes.   You have abdominal pain.  You have a fever.   You become sweaty or weak.   You are passing large blood clots from the vagina.   You start to feel nauseous and vomit. MAKE SURE YOU:   Understand these instructions.  Will watch your condition.  Will get help right away if you are not doing well or get worse. Document Released: 10/26/2005 Document Revised: 10/31/2013 Document Reviewed: 05/25/2013 [employer] Patient Information 2015 [employer]. This information is not intended to replace advice given to you by your health care provider. Make sure you discuss any questions you have with your  health care provider.  

## 2015-07-30 NOTE — ED Notes (Signed)
Dr. Pollina back at the bedside.  

## 2018-01-10 ENCOUNTER — Ambulatory Visit (HOSPITAL_COMMUNITY)
Admission: EM | Admit: 2018-01-10 | Discharge: 2018-01-10 | Disposition: A | Discharge status: Home / Self Care | Payer: Medicaid Other | Attending: Emergency Medicine | Admitting: Emergency Medicine

## 2018-01-10 ENCOUNTER — Encounter (HOSPITAL_COMMUNITY): Payer: Self-pay | Admitting: Emergency Medicine

## 2018-01-10 DIAGNOSIS — R69 Illness, unspecified: Secondary | ICD-10-CM

## 2018-01-10 DIAGNOSIS — J111 Influenza due to unidentified influenza virus with other respiratory manifestations: Secondary | ICD-10-CM

## 2018-01-10 MED ORDER — OSELTAMIVIR PHOSPHATE 75 MG PO CAPS: 75.0000 mg | ORAL_CAPSULE | Freq: Two times a day (BID) | ORAL | 0 refills | Status: AC

## 2018-01-10 NOTE — ED Provider Notes (Signed)
[facility]    CSN: [id number] Arrival date & time: 01/10/18  1808     History   Chief Complaint Chief Complaint  Patient presents with  . URI    HPI Talbot Grumblingdrian Zavadil is a 32 y.o. female.   Koleen Nimroddrian presents with complaints of body aches, headache, sore throat, productive cough which started two days ago. Daughter has been treated for influenza and just completed course of tamiflu. Did not get a flu vaccine this season. Denies gi/gu complaints. Decreased appetite. Mild shortness of breath related to congestion. Temp of 102 last night. Taking mucinex, nyquil and dayquil for symptoms. Last dose this morning. History of gerd, anemia. She does work at a daycare.    ROS per HPI.       Past Medical History:  Diagnosis Date  . Anemia   . Blount's disease   . GERD (gastroesophageal reflux disease)   . Hx of chlamydia infection   . Hx of gonorrhea   . Hx of varicella   . Miscarriage   . Thalassemia trait     Patient Active Problem List   Diagnosis Date Noted  . S/P primary low transverse C-section 06/03/2015  . Post-dates pregnancy   . Fetal macrosomia   . [redacted] weeks gestation of pregnancy   . Pediatric pre-birth visit for expectant parent(s) 05/16/2015  . Nausea with vomiting 10/23/2014    Past Surgical History:  Procedure Laterality Date  . CESAREAN SECTION N/A 06/03/2015   Procedure: CESAREAN SECTION;  Surgeon: Osborn CohoAngela Roberts, MD;  Location: [facility];  Service: Obstetrics;  Laterality: N/A;  . LEG SURGERY     for Blount's?disease (bow leg)  . TONSILLECTOMY      OB History    Gravida Para Term Preterm AB Living   2 1 1   1 1    SAB TAB Ectopic Multiple Live Births   1     0 1       Home Medications    Prior to Admission medications   Medication Sig Start Date End Date Taking? Authorizing Provider  ibuprofen (ADVIL,MOTRIN) 600 MG tablet Take 1 tablet (600 mg total) by mouth every 6 (six) hours. 06/06/15   Standard, Venus, CNM  oseltamivir (TAMIFLU) 75  MG capsule Take 1 capsule (75 mg total) by mouth every 12 (twelve) hours for 5 days. 01/10/18 01/15/18  Georgetta HaberBurky, Kahlee Metivier B, NP  Prenatal Vit-Fe Fumarate-FA (PRENATAL MULTIVITAMIN) TABS tablet Take 1 tablet by mouth daily at 12 noon.    [provider]  PRESCRIPTION MEDICATION Take 1 tablet by mouth daily. Birth control tablet    [provider]    Family History History reviewed. No pertinent family history.  Social History Social History   Tobacco Use  . Smoking status: Never Smoker  Substance Use Topics  . Alcohol use: Yes  . Drug use: No     Allergies   Oxycodone and Shellfish allergy   Review of Systems Review of Systems   Physical Exam Triage Vital Signs ED Triage Vitals  Enc Vitals Group     BP 01/10/18 2015 (!) 167/97     Pulse Rate 01/10/18 2015 (!) 101     Resp 01/10/18 2015 18     Temp 01/10/18 2015 99.2 F (37.3 C)     Temp Source 01/10/18 2015 Oral     SpO2 01/10/18 2015 97 %     Weight --      Height --      Head Circumference --  Peak Flow --      Pain Score 01/10/18 2016 5     Pain Loc --      Pain Edu? --      Excl. in GC? --    No data found.  Updated Vital Signs BP (!) 167/97 (BP Location: Right Arm)   Pulse (!) 101   Temp 99.2 F (37.3 C) (Oral)   Resp 18   SpO2 97%   Visual Acuity Right Eye Distance:   Left Eye Distance:   Bilateral Distance:    Right Eye Near:   Left Eye Near:    Bilateral Near:     Physical Exam  Constitutional: She is oriented to person, place, and time. She appears well-developed and well-nourished. She appears ill. No distress.  HENT:  Head: Normocephalic and atraumatic.  Right Ear: Tympanic membrane, external ear and ear canal normal.  Left Ear: Tympanic membrane, external ear and ear canal normal.  Nose: Rhinorrhea present. Right sinus exhibits no maxillary sinus tenderness and no frontal sinus tenderness. Left sinus exhibits no maxillary sinus tenderness and no frontal sinus  tenderness.  Mouth/Throat: Uvula is midline, oropharynx is clear and moist and mucous membranes are normal. No tonsillar exudate.  Eyes: Conjunctivae and EOM are normal. Pupils are equal, round, and reactive to light.  Cardiovascular: Regular rhythm and normal heart sounds. Tachycardia present.  Pulmonary/Chest: Effort normal and breath sounds normal.  Congested cough noted  Lymphadenopathy:    She has no cervical adenopathy.  Neurological: She is alert and oriented to person, place, and time.  Skin: Skin is warm and dry.     UC Treatments / Results  Labs (all labs ordered are listed, but only abnormal results are displayed) Labs Reviewed - No data to display  EKG  EKG Interpretation None       Radiology No results found.  Procedures Procedures (including critical care time)  Medications Ordered in UC Medications - No data to display   Initial Impression / Assessment and Plan / UC Course  I have reviewed the triage vital signs and the nursing notes.  Pertinent labs & imaging results that were available during my care of the patient were reviewed by me and considered in my medical decision making (see chart for details).     History and physical concerning for influenza, daughter was just treated. Discussed risks/benefits of tamiflu, patient does work at a daycare, opted to initiate at this time. Supportive cares for symptoms. Return precautions provided. Patient verbalized understanding and agreeable to plan.    Final Clinical Impressions(s) / UC Diagnoses   Final diagnoses:  Influenza-like illness    ED Discharge Orders        Ordered    oseltamivir (TAMIFLU) 75 MG capsule  Every 12 hours     01/10/18 2034       Controlled Substance Prescriptions [place] Controlled Substance Registry consulted? Not Applicable   Georgetta Haber, NP 01/10/18 2040

## 2018-01-10 NOTE — Discharge Instructions (Signed)
Push fluids to ensure adequate hydration and keep secretions thin.  Tylenol and/or ibuprofen as needed for pain or fevers.  May continue with over the counter treatments as needed for symptoms. Tamiflu, may cause stomach upset, take with food.  If symptoms worsen or do not improve in the next week to return to be seen or to follow up with your PCP.

## 2018-01-10 NOTE — ED Triage Notes (Signed)
Pt sts URI sx with cough x 3 days with body aches

## 2018-02-06 ENCOUNTER — Encounter (HOSPITAL_COMMUNITY): Payer: Self-pay | Admitting: Family Medicine

## 2018-02-06 ENCOUNTER — Ambulatory Visit (INDEPENDENT_AMBULATORY_CARE_PROVIDER_SITE_OTHER): Payer: Medicaid Other

## 2018-02-06 ENCOUNTER — Ambulatory Visit (HOSPITAL_COMMUNITY)
Admission: EM | Admit: 2018-02-06 | Discharge: 2018-02-06 | Disposition: A | Discharge status: Home / Self Care | Payer: Medicaid Other | Attending: Physician Assistant | Admitting: Physician Assistant

## 2018-02-06 DIAGNOSIS — J0140 Acute pansinusitis, unspecified: Secondary | ICD-10-CM | POA: Diagnosis not present

## 2018-02-06 MED ORDER — FLUTICASONE PROPIONATE 50 MCG/ACT NA SUSP: 2.0000 | Freq: Every day | NASAL | 0 refills | Status: DC

## 2018-02-06 MED ORDER — IPRATROPIUM BROMIDE 0.06 % NA SOLN: 2.0000 | Freq: Four times a day (QID) | NASAL | 0 refills | Status: DC

## 2018-02-06 MED ORDER — AMOXICILLIN-POT CLAVULANATE 875-125 MG PO TABS: 1.0000 | ORAL_TABLET | Freq: Two times a day (BID) | ORAL | 0 refills | Status: DC

## 2018-02-06 NOTE — ED Triage Notes (Signed)
Pt here for nasal and chest  Congestion with cough. Reports mucous and blood tinged. She had the flu a few weeks ago. Taking dayquil and sinus congestion medication.

## 2018-02-06 NOTE — Discharge Instructions (Signed)
Xray negative for pneumonia. Start augmentin for sinus infection. Start flonase, atrovent nasal spray for nasal congestion/drainage. You can use over the counter nasal saline rinse such as neti pot for nasal congestion. Keep hydrated, your urine should be clear to pale yellow in color. Tylenol/motrin for fever and pain. Monitor for any worsening of symptoms, chest pain, shortness of breath, wheezing, swelling of the throat, follow up for reevaluation.   For sore throat try using a honey-based tea. Use 3 teaspoons of honey with juice squeezed from half lemon. Place shaved pieces of ginger into 1/2-1 cup of water and warm over stove top. Then mix the ingredients and repeat every 4 hours as needed.

## 2018-02-06 NOTE — ED Provider Notes (Signed)
[facility]    CSN: [id number] Arrival date & time: 02/06/18  1213     History   Chief Complaint Chief Complaint  Patient presents with  . Otalgia  . Nasal Congestion    HPI Destiny Jones is a 32 y.o. female.   32 year old female comes in for continued URI symptoms after being treated for flulike symptoms 3 weeks ago.  She continues to have nasal congestion, productive cough that can be blood-tinged, sinus pressure, bilateral otalgia.  Denies shortness of breath, wheezing.  Resolved fever.  Continues to take DayQuil, sinus medication without relief.  Never smoker.     Past Medical History:  Diagnosis Date  . Anemia   . Blount's disease   . GERD (gastroesophageal reflux disease)   . Hx of chlamydia infection   . Hx of gonorrhea   . Hx of varicella   . Miscarriage   . Thalassemia trait     Patient Active Problem List   Diagnosis Date Noted  . S/P primary low transverse C-section 06/03/2015  . Post-dates pregnancy   . Fetal macrosomia   . [redacted] weeks gestation of pregnancy   . Pediatric pre-birth visit for expectant parent(s) 05/16/2015  . Nausea with vomiting 10/23/2014    Past Surgical History:  Procedure Laterality Date  . CESAREAN SECTION N/A 06/03/2015   Procedure: CESAREAN SECTION;  Surgeon: Osborn Coho, MD;  Location: [facility];  Service: Obstetrics;  Laterality: N/A;  . LEG SURGERY     for Blount's?disease (bow leg)  . TONSILLECTOMY      OB History    Gravida  2   Para  1   Term  1   Preterm      AB  1   Living  1     SAB  1   TAB      Ectopic      Multiple  0   Live Births  1            Home Medications    Prior to Admission medications   Medication Sig Start Date End Date Taking? Authorizing Provider  amoxicillin-clavulanate (AUGMENTIN) 875-125 MG tablet Take 1 tablet by mouth every 12 (twelve) hours. 02/06/18   Cathie Hoops, Alanis Clift V, PA-C  fluticasone (FLONASE) 50 MCG/ACT nasal spray Place 2 sprays into both nostrils daily.  02/06/18   Cathie Hoops, Shree Espey V, PA-C  ibuprofen (ADVIL,MOTRIN) 600 MG tablet Take 1 tablet (600 mg total) by mouth every 6 (six) hours. 06/06/15   Standard, Venus, CNM  ipratropium (ATROVENT) 0.06 % nasal spray Place 2 sprays into both nostrils 4 (four) times daily. 02/06/18   Belinda Fisher, PA-C  Prenatal Vit-Fe Fumarate-FA (PRENATAL MULTIVITAMIN) TABS tablet Take 1 tablet by mouth daily at 12 noon.    [provider]  PRESCRIPTION MEDICATION Take 1 tablet by mouth daily. Birth control tablet    [provider]    Family History History reviewed. No pertinent family history.  Social History Social History   Tobacco Use  . Smoking status: Never Smoker  Substance Use Topics  . Alcohol use: Yes  . Drug use: No     Allergies   Oxycodone and Shellfish allergy   Review of Systems Review of Systems  Reason unable to perform ROS: See HPI as above.     Physical Exam Triage Vital Signs ED Triage Vitals  Enc Vitals Group     BP 02/06/18 1321 (!) 156/101     Pulse Rate 02/06/18 1321 75  Resp 02/06/18 1321 18     Temp 02/06/18 1321 98.4 F (36.9 C)     Temp Source 02/06/18 1321 Oral     SpO2 02/06/18 1321 100 %     Weight --      Height --      Head Circumference --      Peak Flow --      Pain Score 02/06/18 1319 0     Pain Loc --      Pain Edu? --      Excl. in GC? --    No data found.  Updated Vital Signs BP (!) 156/101   Pulse 75   Temp 98.4 F (36.9 C) (Oral)   Resp 18   LMP 01/06/2018   SpO2 100%   Breastfeeding? No   Physical Exam  Constitutional: She is oriented to person, place, and time. She appears well-developed and well-nourished. No distress.  HENT:  Head: Normocephalic and atraumatic.  Right Ear: Tympanic membrane, external ear and ear canal normal. Tympanic membrane is not erythematous and not bulging.  Left Ear: Tympanic membrane, external ear and ear canal normal. Tympanic membrane is not erythematous and not bulging.  Nose: Right sinus  exhibits maxillary sinus tenderness and frontal sinus tenderness. Left sinus exhibits maxillary sinus tenderness and frontal sinus tenderness.  Mouth/Throat: Uvula is midline, oropharynx is clear and moist and mucous membranes are normal.  Eyes: Pupils are equal, round, and reactive to light. Conjunctivae are normal.  Neck: Normal range of motion. Neck supple.  Cardiovascular: Normal rate, regular rhythm and normal heart sounds. Exam reveals no gallop and no friction rub.  No murmur heard. Pulmonary/Chest: Effort normal and breath sounds normal. She has no decreased breath sounds. She has no wheezes. She has no rhonchi. She has no rales.  Lymphadenopathy:    She has no cervical adenopathy.  Neurological: She is alert and oriented to person, place, and time.  Skin: Skin is warm and dry.  Psychiatric: She has a normal mood and affect. Her behavior is normal. Judgment normal.     UC Treatments / Results  Labs (all labs ordered are listed, but only abnormal results are displayed) Labs Reviewed - No data to display  EKG None Radiology Dg Chest 2 View  Result Date: 02/06/2018 CLINICAL DATA:  Nasal and chest congestion, productive cough w/yellow with red sputum, loss of appetite. Pt sts she had the flu a few weeks ago. Pt denies N/V, temp. No known heart or lung conditions, nonsmoker, nondiabetic. Pt sts that she for.*comment was truncated* EXAM: CHEST - 2 VIEW COMPARISON:  None. FINDINGS: Lateral view degraded by patient arm position. S-shaped thoracic spine curvature. Midline trachea. Normal heart size and mediastinal contours. No pleural effusion or pneumothorax. Clear lungs. IMPRESSION: No acute cardiopulmonary disease. Electronically Signed   By: Jeronimo GreavesKyle  Talbot M.D.   On: 02/06/2018 14:31    Procedures Procedures (including critical care time)  Medications Ordered in UC Medications - No data to display   Initial Impression / Assessment and Plan / UC Course  I have reviewed the triage  vital signs and the nursing notes.  Pertinent labs & imaging results that were available during my care of the patient were reviewed by me and considered in my medical decision making (see chart for details).    Chest x-ray negative for acute cardiopulmonary disease.  We will treat for sinusitis with Augmentin.  Other symptomatic treatment discussed.  Push fluids.  Return precautions given.  Patient expresses understanding  and agrees with plan.  Final Clinical Impressions(s) / UC Diagnoses   Final diagnoses:  Acute non-recurrent pansinusitis    ED Discharge Orders        Ordered    amoxicillin-clavulanate (AUGMENTIN) 875-125 MG tablet  Every 12 hours     02/06/18 1436    fluticasone (FLONASE) 50 MCG/ACT nasal spray  Daily     02/06/18 1436    ipratropium (ATROVENT) 0.06 % nasal spray  4 times daily     02/06/18 1436        Belinda Fisher, [place] 02/06/18 2008

## 2018-07-25 ENCOUNTER — Encounter (HOSPITAL_COMMUNITY): Payer: Self-pay | Admitting: Emergency Medicine

## 2018-07-25 ENCOUNTER — Ambulatory Visit (HOSPITAL_COMMUNITY)
Admission: EM | Admit: 2018-07-25 | Discharge: 2018-07-25 | Disposition: A | Discharge status: Home / Self Care | Payer: Medicaid Other | Attending: Family Medicine | Admitting: Family Medicine

## 2018-07-25 DIAGNOSIS — G43019 Migraine without aura, intractable, without status migrainosus: Secondary | ICD-10-CM | POA: Diagnosis not present

## 2018-07-25 HISTORY — DX: Essential (primary) hypertension: I10

## 2018-07-25 MED ORDER — BUTALBITAL-APAP-CAFFEINE 50-325-40 MG PO TABS: 1.0000 | ORAL_TABLET | Freq: Four times a day (QID) | ORAL | 0 refills | Status: AC | PRN

## 2018-07-25 MED ORDER — KETOROLAC TROMETHAMINE 60 MG/2ML IM SOLN
INTRAMUSCULAR | Status: AC
  Filled 2018-07-25: qty 2

## 2018-07-25 MED ORDER — KETOROLAC TROMETHAMINE 60 MG/2ML IM SOLN
60.0000 mg | Freq: Once | INTRAMUSCULAR | Status: AC
  Administered 2018-07-25: 60 mg via INTRAMUSCULAR

## 2018-07-25 NOTE — ED Provider Notes (Signed)
[facility]    CSN: [id number] Arrival date & time: 07/25/18  1823     History   Chief Complaint Chief Complaint  Patient presents with  . Headache    HPI Destiny Jones is a 32 y.o. female.   Pt states she went to see her PCP on Thursday, showed she had high blood pressure 170s, with headache, given blood pressure medicine, pt has been taking her pills for the last few days but states the headache has gotten worse.   Patient had headaches as a child after she had some trauma, but they resolved and she has not had an ongoing headache problem.  Her headache is not worse with certain positions.  It does get worse with cough.  Patient said no nausea, vomiting, visual scotoma, chest pain, leg swelling, focal weakness, fever, recent head trauma  Patient is a Building surveyor.       Past Medical History:  Diagnosis Date  . Anemia   . Blount's disease   . GERD (gastroesophageal reflux disease)   . Hx of chlamydia infection   . Hx of gonorrhea   . Hx of varicella   . Hypertension   . Miscarriage   . Thalassemia trait     Patient Active Problem List   Diagnosis Date Noted  . S/P primary low transverse C-section 06/03/2015  . Post-dates pregnancy   . Fetal macrosomia   . [redacted] weeks gestation of pregnancy   . Pediatric pre-birth visit for expectant parent(s) 05/16/2015  . Nausea with vomiting 10/23/2014    Past Surgical History:  Procedure Laterality Date  . CESAREAN SECTION N/A 06/03/2015   Procedure: CESAREAN SECTION;  Surgeon: Osborn Coho, MD;  Location: [facility];  Service: Obstetrics;  Laterality: N/A;  . LEG SURGERY     for Blount's?disease (bow leg)  . TONSILLECTOMY      OB History    Gravida  2   Para  1   Term  1   Preterm      AB  1   Living  1     SAB  1   TAB      Ectopic      Multiple  0   Live Births  1            Home Medications    Prior to Admission medications   Medication Sig Start Date End Date Taking?  Authorizing Provider  amLODipine (NORVASC) 2.5 MG tablet Take by mouth. 07/21/18  Yes [provider]  azithromycin (ZITHROMAX) 250 MG tablet Take two tablets on the first day and then one tablet every day after. 07/21/18  Yes [provider]  norethindrone-ethinyl estradiol (MICROGESTIN,JUNEL,LOESTRIN) 1-20 MG-MCG tablet Take by mouth. 06/09/18  Yes [provider]  butalbital-acetaminophen-caffeine (FIORICET, ESGIC) 50-325-40 MG tablet Take 1-2 tablets by mouth every 6 (six) hours as needed for headache. 07/25/18 07/25/19  Elvina Sidle, MD  FERROUS SULFATE PO ferrous sulfate  1 PO QD    [provider]  ibuprofen (ADVIL,MOTRIN) 600 MG tablet Take 1 tablet (600 mg total) by mouth every 6 (six) hours. 06/06/15   Standard, Venus, CNM  Prenatal Vit-Fe Fumarate-FA (PRENATAL MULTIVITAMIN) TABS tablet Take 1 tablet by mouth daily at 12 noon.    [provider]  PRESCRIPTION MEDICATION Take 1 tablet by mouth daily. Birth control tablet    [provider]    Family History No family history on file.  Social History Social History   Tobacco Use  .  Smoking status: Never Smoker  Substance Use Topics  . Alcohol use: Yes  . Drug use: No     Allergies   Oxycodone and Shellfish allergy   Review of Systems Review of Systems   Physical Exam Triage Vital Signs ED Triage Vitals [07/25/18 1905]  Enc Vitals Group     BP (!) 156/97     Pulse Rate 94     Resp 16     Temp 98.9 F (37.2 C)     Temp src      SpO2 100 %     Weight      Height      Head Circumference      Peak Flow      Pain Score      Pain Loc      Pain Edu?      Excl. in GC?    No data found.  Updated Vital Signs BP (!) 156/97   Pulse 94   Temp 98.9 F (37.2 C)   Resp 16   SpO2 100%   Physical Exam  Constitutional: She is oriented to person, place, and time. She appears well-developed and well-nourished.  HENT:  Head: Normocephalic and atraumatic.    Mouth/Throat: Oropharynx is clear and moist.  Eyes: Pupils are equal, round, and reactive to light. EOM are normal.  Neck: Normal range of motion. Neck supple.  Cardiovascular: Normal rate and normal heart sounds.  Pulmonary/Chest: Effort normal and breath sounds normal.  Abdominal: Soft.  Neurological: She is alert and oriented to person, place, and time. She has normal strength. She displays a negative Romberg sign.  Skin: Skin is warm and dry.  Psychiatric: She has a normal mood and affect. Her behavior is normal.  Nursing note and vitals reviewed.    UC Treatments / Results  Labs (all labs ordered are listed, but only abnormal results are displayed) Labs Reviewed - No data to display  EKG None  Radiology No results found.  Procedures Procedures (including critical care time)  Medications Ordered in UC Medications  ketorolac (TORADOL) injection 60 mg (60 mg Intramuscular Given 07/25/18 1937)    Initial Impression / Assessment and Plan / UC Course  I have reviewed the triage vital signs and the nursing notes.  Pertinent labs & imaging results that were available during my care of the patient were reviewed by me and considered in my medical decision making (see chart for details).     Final Clinical Impressions(s) / UC Diagnoses   Final diagnoses:  Intractable migraine without aura and without status migrainosus     Discharge Instructions     Take two of the blood pressure tablets for the next three nights.  Please return if headache is as bad or worse tomorrow.    ED Prescriptions    Medication Sig Dispense Auth. Provider   butalbital-acetaminophen-caffeine (FIORICET, ESGIC) 50-325-40 MG tablet Take 1-2 tablets by mouth every 6 (six) hours as needed for headache. 20 tablet Elvina SidleLauenstein, Kaylinn Dedic, MD     Controlled Substance Prescriptions [place] Controlled Substance Registry consulted? Not Applicable   Elvina SidleLauenstein, Reynold Mantell, MD 07/25/18 [phone]

## 2018-07-25 NOTE — Discharge Instructions (Signed)
Take two of the blood pressure tablets for the next three nights.  Please return if headache is as bad or worse tomorrow.

## 2018-07-25 NOTE — ED Triage Notes (Signed)
Pt states she went to see her PCP on Thursday, showed she had high blood pressure 170s, with headache, given blood pressure medicine, pt has been taking her pills for the last few days but states the headache has gotten worse.

## 2019-07-23 ENCOUNTER — Ambulatory Visit (HOSPITAL_COMMUNITY)
Admission: EM | Admit: 2019-07-23 | Discharge: 2019-07-23 | Disposition: A | Discharge status: Home / Self Care | Payer: BLUE CROSS/BLUE SHIELD | Attending: Family Medicine | Admitting: Family Medicine

## 2019-07-23 ENCOUNTER — Other Ambulatory Visit: Payer: Self-pay

## 2019-07-23 ENCOUNTER — Encounter (HOSPITAL_COMMUNITY): Payer: Self-pay

## 2019-07-23 DIAGNOSIS — N390 Urinary tract infection, site not specified: Secondary | ICD-10-CM | POA: Diagnosis present

## 2019-07-23 DIAGNOSIS — B9689 Other specified bacterial agents as the cause of diseases classified elsewhere: Secondary | ICD-10-CM | POA: Diagnosis not present

## 2019-07-23 LAB — POCT URINALYSIS DIP (DEVICE)
Bilirubin Urine: NEGATIVE
Glucose, UA: NEGATIVE mg/dL
Ketones, ur: NEGATIVE mg/dL
Nitrite: POSITIVE — AB
Protein, ur: NEGATIVE mg/dL
Specific Gravity, Urine: >=1.03 (ref 1.005–1.030)
Urobilinogen, UA: 0.2 mg/dL (ref 0.0–1.0)
pH: 6 (ref 5.0–8.0)

## 2019-07-23 MED ORDER — NITROFURANTOIN MONOHYD MACRO 100 MG PO CAPS: 100.0000 mg | ORAL_CAPSULE | Freq: Two times a day (BID) | ORAL | 0 refills | Status: DC

## 2019-07-23 NOTE — ED Provider Notes (Signed)
[facility]    CSN: [id number] Arrival date & time: 07/23/19  1020      History   Chief Complaint Chief Complaint  Patient presents with  . Urinary Tract Infection    HPI Destiny Jones is a 33 y.o. female.   HPI  Patient states that she started yesterday with symptoms suggestive of a urinary tract infection.  She has dysuria and frequency.  Some urgency.  No flank pain.  No nausea or vomiting.  No fever or chills.  No prior history of any kidney infection or kidney problem.  She is certain that she is not pregnant.  She denies any vaginal discharge or need for STD testing  Past Medical History:  Diagnosis Date  . Anemia   . Blount's disease   . GERD (gastroesophageal reflux disease)   . Hx of chlamydia infection   . Hx of gonorrhea   . Hx of varicella   . Hypertension   . Miscarriage   . Thalassemia trait     Patient Active Problem List   Diagnosis Date Noted  . S/P primary low transverse C-section 06/03/2015  . Post-dates pregnancy   . Fetal macrosomia   . [redacted] weeks gestation of pregnancy   . Pediatric pre-birth visit for expectant parent(s) 05/16/2015  . Nausea with vomiting 10/23/2014    Past Surgical History:  Procedure Laterality Date  . CESAREAN SECTION N/A 06/03/2015   Procedure: CESAREAN SECTION;  Surgeon: Osborn Coho, MD;  Location: [facility];  Service: Obstetrics;  Laterality: N/A;  . LEG SURGERY     for Blount's?disease (bow leg)  . TONSILLECTOMY      OB History    Gravida  2   Para  1   Term  1   Preterm      AB  1   Living  1     SAB  1   TAB      Ectopic      Multiple  0   Live Births  1            Home Medications    Prior to Admission medications   Medication Sig Start Date End Date Taking? Authorizing Provider  amLODipine (NORVASC) 2.5 MG tablet Take by mouth. 07/21/18   [provider]  butalbital-acetaminophen-caffeine (FIORICET, ESGIC) [id number] MG tablet Take 1-2 tablets by mouth every 6  (six) hours as needed for headache. 07/25/18 07/25/19  Elvina Sidle, MD  FERROUS SULFATE PO ferrous sulfate  1 PO QD    [provider]  ibuprofen (ADVIL,MOTRIN) 600 MG tablet Take 1 tablet (600 mg total) by mouth every 6 (six) hours. 06/06/15   Standard, Venus, CNM  nitrofurantoin, macrocrystal-monohydrate, (MACROBID) 100 MG capsule Take 1 capsule (100 mg total) by mouth 2 (two) times daily. 07/23/19   Eustace Moore, MD  norethindrone-ethinyl estradiol (MICROGESTIN,JUNEL,LOESTRIN) 1-20 MG-MCG tablet Take by mouth. 06/09/18   [provider]  PRESCRIPTION MEDICATION Take 1 tablet by mouth daily. Birth control tablet    [provider]    Family History Family History  Problem Relation Age of Onset  . Healthy Mother   . Healthy Father     Social History Social History   Tobacco Use  . Smoking status: Never Smoker  . Smokeless tobacco: Never Used  Substance Use Topics  . Alcohol use: Yes    Comment: occasionally  . Drug use: No     Allergies   Oxycodone and Shellfish allergy   Review of  Systems Review of Systems  Constitutional: Negative for chills and fever.  HENT: Negative for ear pain and sore throat.   Eyes: Negative for pain and visual disturbance.  Respiratory: Negative for cough and shortness of breath.   Cardiovascular: Negative for chest pain and palpitations.  Gastrointestinal: Negative for abdominal pain, nausea and vomiting.  Genitourinary: Positive for dysuria and frequency. Negative for flank pain and hematuria.  Musculoskeletal: Negative for arthralgias and back pain.  Skin: Negative for color change and rash.  Neurological: Negative for seizures and syncope.  All other systems reviewed and are negative.    Physical Exam Triage Vital Signs ED Triage Vitals  Enc Vitals Group     BP 07/23/19 1113 (!) 151/83     Pulse Rate 07/23/19 1113 87     Resp 07/23/19 1113 16     Temp 07/23/19 1113 97.9 F (36.6 C)     Temp Source  07/23/19 1113 Temporal     SpO2 07/23/19 1113 100 %     Weight --      Height --      Head Circumference --      Peak Flow --      Pain Score 07/23/19 1110 8     Pain Loc --      Pain Edu? --      Excl. in GC? --    No data found.  Updated Vital Signs BP (!) 151/83 (BP Location: Right Arm)   Pulse 87   Temp 97.9 F (36.6 C) (Temporal)   Resp 16   SpO2 100%      Physical Exam Constitutional:      General: She is not in acute distress.    Appearance: She is well-developed.  HENT:     Head: Normocephalic and atraumatic.  Eyes:     Conjunctiva/sclera: Conjunctivae normal.     Pupils: Pupils are equal, round, and reactive to light.  Neck:     Musculoskeletal: Normal range of motion.  Cardiovascular:     Rate and Rhythm: Normal rate and regular rhythm.     Heart sounds: Normal heart sounds.  Pulmonary:     Effort: Pulmonary effort is normal. No respiratory distress.     Breath sounds: Normal breath sounds.  Abdominal:     General: There is no distension.     Palpations: Abdomen is soft.     Tenderness: There is no right CVA tenderness or left CVA tenderness.  Musculoskeletal: Normal range of motion.  Skin:    General: Skin is warm and dry.  Neurological:     Mental Status: She is alert.  Psychiatric:        Mood and Affect: Mood normal.        Behavior: Behavior normal.      UC Treatments / Results  Labs (all labs ordered are listed, but only abnormal results are displayed) Labs Reviewed  POCT URINALYSIS DIP (DEVICE) - Abnormal; Notable for the following components:      Result Value   Hgb urine dipstick SMALL (*)    Nitrite POSITIVE (*)    Leukocytes,Ua SMALL (*)    All other components within normal limits  URINE CULTURE    EKG   Radiology No results found.  Procedures Procedures (including critical care time)  Medications Ordered in UC Medications - No data to display  Initial Impression / Assessment and Plan / UC Course  I have reviewed  the triage vital signs and the nursing notes.  Pertinent  labs & imaging results that were available during my care of the patient were reviewed by me and considered in my medical decision making (see chart for details).      Final Clinical Impressions(s) / UC Diagnoses   Final diagnoses:  Lower urinary tract infectious disease     Discharge Instructions     You have a bladder infection Drink plenty of water Take antibiotic 2 x a day for 5 days We will call if there is any need for alteration of treatment     ED Prescriptions    Medication Sig Dispense Auth. Provider   nitrofurantoin, macrocrystal-monohydrate, (MACROBID) 100 MG capsule Take 1 capsule (100 mg total) by mouth 2 (two) times daily. 10 capsule Raylene Everts, MD     Controlled Substance Prescriptions [place] Controlled Substance Registry consulted? Not Applicable   Raylene Everts, MD 07/23/19 1139

## 2019-07-23 NOTE — Discharge Instructions (Signed)
You have a bladder infection Drink plenty of water Take antibiotic 2 x a day for 5 days We will call if there is any need for alteration of treatment

## 2019-07-23 NOTE — ED Triage Notes (Signed)
Patient presents to Urgent Care with complaints of UTI symptoms including painful frequent urination since yesterday.

## 2019-07-26 ENCOUNTER — Telehealth (HOSPITAL_COMMUNITY): Payer: Self-pay | Admitting: Emergency Medicine

## 2019-07-26 LAB — URINE CULTURE: Culture: >=100000 — AB

## 2019-07-26 NOTE — Telephone Encounter (Signed)
Urine culture was positive for e coli and was given  macrobid at urgent care visit. Attempted to reach patient. No answer at this time.   

## 2019-12-09 ENCOUNTER — Encounter (HOSPITAL_COMMUNITY): Payer: Self-pay | Admitting: *Deleted

## 2019-12-09 ENCOUNTER — Ambulatory Visit (HOSPITAL_COMMUNITY)
Admission: EM | Admit: 2019-12-09 | Discharge: 2019-12-09 | Disposition: A | Discharge status: Home / Self Care | Payer: BLUE CROSS/BLUE SHIELD | Attending: Family Medicine | Admitting: Family Medicine

## 2019-12-09 ENCOUNTER — Other Ambulatory Visit: Payer: Self-pay

## 2019-12-09 DIAGNOSIS — R300 Dysuria: Secondary | ICD-10-CM | POA: Diagnosis not present

## 2019-12-09 DIAGNOSIS — I10 Essential (primary) hypertension: Secondary | ICD-10-CM | POA: Diagnosis present

## 2019-12-09 DIAGNOSIS — N3090 Cystitis, unspecified without hematuria: Secondary | ICD-10-CM | POA: Diagnosis present

## 2019-12-09 DIAGNOSIS — Z3200 Encounter for pregnancy test, result unknown: Secondary | ICD-10-CM

## 2019-12-09 LAB — POCT URINALYSIS DIP (DEVICE)
Bilirubin Urine: NEGATIVE
Glucose, UA: 250 mg/dL — AB
Ketones, ur: NEGATIVE mg/dL
Nitrite: POSITIVE — AB
Protein, ur: 30 mg/dL — AB
Specific Gravity, Urine: <=1.005 (ref 1.005–1.030)
Urobilinogen, UA: 2 mg/dL — ABNORMAL HIGH (ref 0.0–1.0)
pH: 5.5 (ref 5.0–8.0)

## 2019-12-09 MED ORDER — CEPHALEXIN 500 MG PO CAPS: 500.0000 mg | ORAL_CAPSULE | Freq: Two times a day (BID) | ORAL | 0 refills | Status: DC

## 2019-12-09 NOTE — ED Triage Notes (Signed)
C/O dysuria, urinary urgency and polyuria over past couple days without fevers.  Having some abd pain; denies flank pain.

## 2019-12-10 LAB — URINE CULTURE: Culture: NO GROWTH

## 2019-12-11 NOTE — ED Provider Notes (Addendum)
[facility]    ASSESSMENT & PLAN:  1. Cystitis   2. Elevated blood pressure reading in office with diagnosis of hypertension     Begin: Meds ordered this encounter  Medications  . cephALEXin (KEFLEX) 500 MG capsule    Sig: Take 1 capsule (500 mg total) by mouth 2 (two) times daily.    Dispense:  10 capsule    Refill:  0    No signs of pyelonephritis. Discussed. Urine culture sent. Will notify patient when results available. Will follow up with her PCP or here if not showing improvement over the next 48 hours, sooner if needed.  Outlined signs and symptoms indicating need for more acute intervention. Patient verbalized understanding. After Visit Summary given.  SUBJECTIVE:  Destiny Jones is a 34 y.o. female who complains of urinary frequency, urgency and dysuria for the past 1-2 days. Without associated flank pain, fever, chills, vaginal discharge or bleeding. Gross hematuria: not present. No specific aggravating or alleviating factors reported. No LE edema. Normal PO intake without n/v/d. Without specific abdominal pain. Ambulatory without difficulty. OTC treatment: none reported. H/O UTI: yes with similar symptoms.  LMP: Patient's last menstrual period was 12/01/2019 (exact date).  Increased blood pressure noted today. Reports that she is treated for HTN.  She reports taking medications as instructed, no chest pain on exertion, no dyspnea on exertion, no swelling of ankles, no orthostatic dizziness or lightheadedness, no orthopnea or paroxysmal nocturnal dyspnea, no palpitations and no intermittent claudication symptoms.   OBJECTIVE:  Vitals:   12/09/19 1731  BP: (!) 160/91  Pulse: 79  Resp: 18  Temp: 98.1 F (36.7 C)  TempSrc: Oral  SpO2: 100%   General appearance: alert; no distress HENT: oropharynx: moist Lungs: unlabored respirations Abdomen: soft Back: no CVA tenderness Extremities: no edema visualized Skin: warm and dry Neurologic: normal  gait Psychological: alert and cooperative; normal mood and affect  Labs Reviewed  POCT URINALYSIS DIP (DEVICE) - Abnormal; Notable for the following components:      Result Value   Glucose, UA 250 (*)    Hgb urine dipstick SMALL (*)    Protein, ur 30 (*)    Urobilinogen, UA 2.0 (*)    Nitrite POSITIVE (*)    Leukocytes,Ua LARGE (*)    All other components within normal limits  URINE CULTURE  POCT PREGNANCY, URINE    Allergies  Allergen Reactions  . Oxycodone Itching  . Shellfish Allergy Hives    Happened at age 79y, has not had shellfish since    Past Medical History:  Diagnosis Date  . Anemia   . Blount's disease   . GERD (gastroesophageal reflux disease)   . Hx of chlamydia infection   . Hx of gonorrhea   . Hx of varicella   . Hypertension   . Miscarriage   . Thalassemia trait    Social History   Socioeconomic History  . Marital status: Single    Spouse name: Not on file  . Number of children: Not on file  . Years of education: Not on file  . Highest education level: Not on file  Occupational History  . Not on file  Tobacco Use  . Smoking status: Never Smoker  . Smokeless tobacco: Never Used  Substance and Sexual Activity  . Alcohol use: Yes    Comment: occasionally  . Drug use: No  . Sexual activity: Yes    Birth control/protection: Condom  Other Topics Concern  . Not on file  Social  History Narrative  . Not on file   Social Determinants of Health   Financial Resource Strain:   . Difficulty of Paying Living Expenses: Not on file  Food Insecurity:   . Worried About Programme researcher, broadcasting/film/video in the Last Year: Not on file  . Ran Out of Food in the Last Year: Not on file  Transportation Needs:   . Lack of Transportation (Medical): Not on file  . Lack of Transportation (Non-Medical): Not on file  Physical Activity:   . Days of Exercise per Week: Not on file  . Minutes of Exercise per Session: Not on file  Stress:   . Feeling of Stress : Not on file    Social Connections:   . Frequency of Communication with Friends and Family: Not on file  . Frequency of Social Gatherings with Friends and Family: Not on file  . Attends Religious Services: Not on file  . Active Member of Clubs or Organizations: Not on file  . Attends Banker Meetings: Not on file  . Marital Status: Not on file  Intimate Partner Violence:   . Fear of Current or Ex-Partner: Not on file  . Emotionally Abused: Not on file  . Physically Abused: Not on file  . Sexually Abused: Not on file   Family History  Problem Relation Age of Onset  . Healthy Mother   . Healthy Father        Mardella Layman, MD 12/11/19 8185    Mardella Layman, MD 12/11/19 [phone]

## 2020-04-09 ENCOUNTER — Encounter (HOSPITAL_COMMUNITY): Payer: Self-pay

## 2020-04-09 ENCOUNTER — Ambulatory Visit (HOSPITAL_COMMUNITY)
Admission: EM | Admit: 2020-04-09 | Discharge: 2020-04-09 | Disposition: A | Discharge status: Home / Self Care | Payer: BLUE CROSS/BLUE SHIELD

## 2020-04-09 ENCOUNTER — Other Ambulatory Visit: Payer: Self-pay

## 2020-04-09 DIAGNOSIS — I10 Essential (primary) hypertension: Secondary | ICD-10-CM

## 2020-04-09 DIAGNOSIS — R030 Elevated blood-pressure reading, without diagnosis of hypertension: Secondary | ICD-10-CM

## 2020-04-09 DIAGNOSIS — M5412 Radiculopathy, cervical region: Secondary | ICD-10-CM

## 2020-04-09 DIAGNOSIS — M79601 Pain in right arm: Secondary | ICD-10-CM

## 2020-04-09 DIAGNOSIS — M542 Cervicalgia: Secondary | ICD-10-CM

## 2020-04-09 MED ORDER — TIZANIDINE HCL 4 MG PO TABS: 4.0000 mg | ORAL_TABLET | Freq: Three times a day (TID) | ORAL | 0 refills | Status: DC | PRN

## 2020-04-09 MED ORDER — PREDNISONE 20 MG PO TABS: ORAL_TABLET | ORAL | 0 refills | Status: DC

## 2020-04-09 MED ORDER — AMLODIPINE BESYLATE 5 MG PO TABS: 5.0000 mg | ORAL_TABLET | Freq: Every day | ORAL | 0 refills | Status: DC

## 2020-04-09 NOTE — ED Triage Notes (Addendum)
Pt states she worked out at [employer] a couple weeks ago and the next day experienced right arm pain/numbness from shoulder to hand.  Denies further neuro complaint (negative for slurred speech, ataxia, dizziness or changes in vision.) Pt states she has been using nighttime ibuprofen to help with sleep with only mild temporary improvement in symptoms. Pt able to use right hand/arm to scroll on phone.

## 2020-04-09 NOTE — Discharge Instructions (Addendum)

## 2020-04-09 NOTE — ED Provider Notes (Signed)
[facility]   MRN: [id number] DOB: [birth year]  Subjective:   Destiny Jones is a 34 y.o. female presenting for 2-week history of persistent and worsening right-sided neck pain that radiates into her right arm, progressive weakness and numbness of her right arm.  Has had significant concerns lately due to her numbness and weakness, has been limiting how much she is carrying her son.  Denies any particular trauma, fall, history of neck issues.  Symptoms started the day after she did a workout with polyps.  She does not do her body weight, does about 20 pounds worth.  This is a normal workout for her.  She does have a history of persistent high blood pressure.  She is currently trying to become pregnant and is only on labetalol.  No current facility-administered medications for this encounter.  Current Outpatient Medications:  .  labetalol (NORMODYNE) 100 MG tablet, Take by mouth., Disp: , Rfl:  .  amLODipine (NORVASC) 2.5 MG tablet, Take by mouth., Disp: , Rfl:  .  cephALEXin (KEFLEX) 500 MG capsule, Take 1 capsule (500 mg total) by mouth 2 (two) times daily., Disp: 10 capsule, Rfl: 0 .  FERROUS SULFATE PO, ferrous sulfate  1 PO QD, Disp: , Rfl:  .  ibuprofen (ADVIL,MOTRIN) 600 MG tablet, Take 1 tablet (600 mg total) by mouth every 6 (six) hours., Disp: 30 tablet, Rfl: 0 .  nitrofurantoin, macrocrystal-monohydrate, (MACROBID) 100 MG capsule, Take 1 capsule (100 mg total) by mouth 2 (two) times daily., Disp: 10 capsule, Rfl: 0 .  norethindrone-ethinyl estradiol (MICROGESTIN,JUNEL,LOESTRIN) 1-20 MG-MCG tablet, Take by mouth., Disp: , Rfl:  .  PRESCRIPTION MEDICATION, Take 1 tablet by mouth daily. Birth control tablet, Disp: , Rfl:    Allergies  Allergen Reactions  . Oxycodone Itching  . Shellfish Allergy Hives    Happened at age 89y, has not had shellfish since    Past Medical History:  Diagnosis Date  . Anemia   . Blount's disease   . GERD (gastroesophageal reflux disease)   . Hx  of chlamydia infection   . Hx of gonorrhea   . Hx of varicella   . Hypertension   . Miscarriage   . Thalassemia trait      Past Surgical History:  Procedure Laterality Date  . CESAREAN SECTION N/A 06/03/2015   Procedure: CESAREAN SECTION;  Surgeon: Everett Graff, MD;  Location: [facility];  Service: Obstetrics;  Laterality: N/A;  . LEG SURGERY     for Blount's?disease (bow leg)  . TONSILLECTOMY      Family History  Problem Relation Age of Onset  . Healthy Mother   . Healthy Father     Social History   Tobacco Use  . Smoking status: Never Smoker  . Smokeless tobacco: Never Used  Substance Use Topics  . Alcohol use: Yes    Comment: occasionally  . Drug use: No    ROS Denies headache, confusion, vision change, chest pain, shortness of breath, heart racing, belly pain, hematuria.  Objective:   Vitals: BP (!) 175/98 (BP Location: Right Arm)   Pulse 85   Temp 98.9 F (37.2 C)   Resp 17   LMP 03/29/2020   SpO2 100%   BP 174/89 on recheck using her left arm.   BP Readings from Last 3 Encounters:  04/09/20 (!) 175/98  12/09/19 (!) 160/91  07/23/19 (!) 151/83   Physical Exam Constitutional:      General: She is not in acute distress.    Appearance:  Normal appearance. She is well-developed. She is not ill-appearing, toxic-appearing or diaphoretic.  HENT:     Head: Normocephalic and atraumatic.     Nose: Nose normal.     Mouth/Throat:     Mouth: Mucous membranes are moist.  Eyes:     General: No scleral icterus.       Right eye: No discharge.        Left eye: No discharge.     Extraocular Movements: Extraocular movements intact.     Conjunctiva/sclera: Conjunctivae normal.     Pupils: Pupils are equal, round, and reactive to light.  Cardiovascular:     Rate and Rhythm: Normal rate and regular rhythm.     Pulses: Normal pulses.     Heart sounds: Normal heart sounds. No murmur. No friction rub. No gallop.   Pulmonary:     Effort: Pulmonary effort is normal.  No respiratory distress.     Breath sounds: Normal breath sounds. No stridor. No wheezing, rhonchi or rales.  Musculoskeletal:     Cervical back: Tenderness (lower neck, base of neck) present. No swelling, edema, deformity, erythema, signs of trauma, lacerations, rigidity, spasms, torticollis, bony tenderness or crepitus. Pain with movement present. Normal range of motion.     Comments: Strength of right arm is 4/5 compared to the left.  Sharp sensation absent in right arm.  Skin:    General: Skin is warm and dry.     Findings: No rash.  Neurological:     Mental Status: She is alert and oriented to person, place, and time.     Cranial Nerves: No cranial nerve deficit.     Coordination: Coordination normal.     Gait: Gait normal.     Deep Tendon Reflexes: Reflexes normal.     Comments: Negative Romberg and pronator drift.  Psychiatric:        Mood and Affect: Mood normal.        Behavior: Behavior normal.        Thought Content: Thought content normal.        Judgment: Judgment normal.    Negative pregnancy test by verbal report, result did not cross over.  Assessment and Plan :   PDMP not reviewed this encounter.  1. Cervical radiculopathy   2. Right arm pain   3. Neck pain   4. Essential hypertension   5. Elevated blood pressure reading     Had extensive discussion with patient about the need for management of her cervical radiculopathy, numbness and weakness of her right arm through high-dose steroids and an urgent consult with neurosurgery.  Patient has longstanding high blood pressure and emphasized need for dietary modifications and consideration for starting blood pressure management with amlodipine.  Patient is planning on getting pregnant but is agreeable to holding off on this due to her current health status. Counseled patient on potential for adverse effects with medications prescribed/recommended today, ER and return-to-clinic precautions discussed, patient verbalized  understanding.    Wallis Bamberg, [place] 04/09/20 2105

## 2020-04-10 LAB — POC URINE PREG, ED: Preg Test, Ur: NEGATIVE

## 2020-05-27 ENCOUNTER — Other Ambulatory Visit: Payer: Self-pay

## 2020-05-27 ENCOUNTER — Encounter (HOSPITAL_COMMUNITY): Payer: Self-pay | Admitting: Emergency Medicine

## 2020-05-27 ENCOUNTER — Emergency Department (HOSPITAL_COMMUNITY)
Admission: EM | Admit: 2020-05-27 | Discharge: 2020-05-27 | Disposition: A | Discharge status: Home / Self Care | Payer: BLUE CROSS/BLUE SHIELD | Attending: Emergency Medicine | Admitting: Emergency Medicine

## 2020-05-27 DIAGNOSIS — Z79899 Other long term (current) drug therapy: Secondary | ICD-10-CM | POA: Insufficient documentation

## 2020-05-27 DIAGNOSIS — Y939 Activity, unspecified: Secondary | ICD-10-CM | POA: Diagnosis not present

## 2020-05-27 DIAGNOSIS — Y929 Unspecified place or not applicable: Secondary | ICD-10-CM | POA: Insufficient documentation

## 2020-05-27 DIAGNOSIS — M546 Pain in thoracic spine: Secondary | ICD-10-CM | POA: Diagnosis present

## 2020-05-27 DIAGNOSIS — I10 Essential (primary) hypertension: Secondary | ICD-10-CM | POA: Insufficient documentation

## 2020-05-27 DIAGNOSIS — Y999 Unspecified external cause status: Secondary | ICD-10-CM | POA: Diagnosis not present

## 2020-05-27 MED ORDER — CYCLOBENZAPRINE HCL 10 MG PO TABS: 10.0000 mg | ORAL_TABLET | Freq: Two times a day (BID) | ORAL | 0 refills | Status: DC | PRN

## 2020-05-27 NOTE — ED Notes (Signed)
Pt. Back out in waiting area after daughter was discharged.

## 2020-05-27 NOTE — ED Notes (Signed)
Pt ambulatory to the restroom without difficulty. Gait steady and even.  

## 2020-05-27 NOTE — ED Notes (Signed)
Mother is going with daughter to the pediatric unit.

## 2020-05-27 NOTE — ED Notes (Signed)
Patient Alert and oriented to baseline. Stable and ambulatory to baseline. Patient verbalized understanding of the discharge instructions.  Patient belongings were taken by the patient.   
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
## 2021-05-23 ENCOUNTER — Ambulatory Visit (HOSPITAL_COMMUNITY): Admission: EM | Admit: 2021-05-23 | Discharge: 2021-05-23 | Payer: 59

## 2021-05-23 ENCOUNTER — Inpatient Hospital Stay (HOSPITAL_COMMUNITY): Payer: 59

## 2021-05-23 ENCOUNTER — Encounter (HOSPITAL_COMMUNITY): Payer: Self-pay | Admitting: Obstetrics & Gynecology

## 2021-05-23 ENCOUNTER — Inpatient Hospital Stay (HOSPITAL_COMMUNITY)
Admission: EM | Admit: 2021-05-23 | Discharge: 2021-05-23 | Disposition: A | Discharge status: Home / Self Care | Payer: 59 | Attending: Obstetrics & Gynecology | Admitting: Obstetrics & Gynecology

## 2021-05-23 ENCOUNTER — Other Ambulatory Visit: Payer: Self-pay

## 2021-05-23 DIAGNOSIS — Z885 Allergy status to narcotic agent status: Secondary | ICD-10-CM | POA: Diagnosis not present

## 2021-05-23 DIAGNOSIS — O10911 Unspecified pre-existing hypertension complicating pregnancy, first trimester: Secondary | ICD-10-CM | POA: Insufficient documentation

## 2021-05-23 DIAGNOSIS — Z79899 Other long term (current) drug therapy: Secondary | ICD-10-CM | POA: Diagnosis not present

## 2021-05-23 DIAGNOSIS — Z6790 Unspecified blood type, Rh positive: Secondary | ICD-10-CM | POA: Diagnosis not present

## 2021-05-23 DIAGNOSIS — Z793 Long term (current) use of hormonal contraceptives: Secondary | ICD-10-CM | POA: Insufficient documentation

## 2021-05-23 DIAGNOSIS — Z3A01 Less than 8 weeks gestation of pregnancy: Secondary | ICD-10-CM | POA: Insufficient documentation

## 2021-05-23 DIAGNOSIS — O3680X0 Pregnancy with inconclusive fetal viability, not applicable or unspecified: Secondary | ICD-10-CM

## 2021-05-23 DIAGNOSIS — O10919 Unspecified pre-existing hypertension complicating pregnancy, unspecified trimester: Secondary | ICD-10-CM

## 2021-05-23 DIAGNOSIS — O209 Hemorrhage in early pregnancy, unspecified: Secondary | ICD-10-CM | POA: Insufficient documentation

## 2021-05-23 DIAGNOSIS — O26891 Other specified pregnancy related conditions, first trimester: Secondary | ICD-10-CM | POA: Insufficient documentation

## 2021-05-23 DIAGNOSIS — R109 Unspecified abdominal pain: Secondary | ICD-10-CM | POA: Insufficient documentation

## 2021-05-23 DIAGNOSIS — O4690 Antepartum hemorrhage, unspecified, unspecified trimester: Secondary | ICD-10-CM

## 2021-05-23 LAB — CBC
HCT: 35.7 % — ABNORMAL LOW (ref 36.0–46.0)
Hemoglobin: 11.3 g/dL — ABNORMAL LOW (ref 12.0–15.0)
MCH: 24 pg — ABNORMAL LOW (ref 26.0–34.0)
MCHC: 31.7 g/dL (ref 30.0–36.0)
MCV: 75.8 fL — ABNORMAL LOW (ref 80.0–100.0)
Platelets: 324 10*3/uL (ref 150–400)
RBC: 4.71 MIL/uL (ref 3.87–5.11)
RDW: 14.7 % (ref 11.5–15.5)
WBC: 7.4 10*3/uL (ref 4.0–10.5)
nRBC: 0 % (ref 0.0–0.2)

## 2021-05-23 LAB — URINALYSIS, ROUTINE W REFLEX MICROSCOPIC
Bilirubin Urine: NEGATIVE
Glucose, UA: NEGATIVE mg/dL
Hgb urine dipstick: NEGATIVE
Ketones, ur: NEGATIVE mg/dL
Leukocytes,Ua: NEGATIVE
Nitrite: NEGATIVE
Protein, ur: NEGATIVE mg/dL
Specific Gravity, Urine: 1.018 (ref 1.005–1.030)
pH: 5 (ref 5.0–8.0)

## 2021-05-23 LAB — WET PREP, GENITAL
Sperm: NONE SEEN
Trich, Wet Prep: NONE SEEN
Yeast Wet Prep HPF POC: NONE SEEN

## 2021-05-23 LAB — POC URINE PREG, ED: Preg Test, Ur: POSITIVE — AB

## 2021-05-23 LAB — HCG, QUANTITATIVE, PREGNANCY: hCG, Beta Chain, Quant, S: 584 m[IU]/mL — ABNORMAL HIGH (ref <5)

## 2021-05-23 MED ORDER — LABETALOL HCL 200 MG PO TABS: 200.0000 mg | ORAL_TABLET | Freq: Two times a day (BID) | ORAL | 0 refills | Status: DC

## 2021-05-23 NOTE — ED Notes (Addendum)
Patient is being discharged from the Urgent Care and sent to the Emergency Department via POV . Per Raspet, PA patient is in need of higher level of care due to positive pregnancy with severe abdominal cramping and vaginal bleeding . Patient is aware and verbalizes understanding of plan of care.  Vitals:   05/23/21 1334  BP: (S) (!) 166/96  Pulse: 92  Temp: 99.1 F (37.3 C)  SpO2: 100%

## 2021-05-23 NOTE — MAU Provider Note (Signed)
History     CSN: [id number]  Arrival date and time: 05/23/21 1345   Event Date/Time   First Provider Initiated Contact with Patient 05/23/21 1520      Chief Complaint  Patient presents with   Vaginal Bleeding   Abdominal Pain   35 y.o. G3P1011 @[redacted]w[redacted]d  by sure LMP presenting with VB and cramping. Sx started 3-4 days ago. Bleeding was heavy as a period at first but spotting now. Cramping and bilateral and central in lower abdomen. Rates pain 9/10. Has not treated it. Denies urinary sx. Reports 3 days of loose stools. Hx of CHTN on Norvasc, took last yesterday.    OB History     Gravida  3   Para  1   Term  1   Preterm      AB  1   Living  1      SAB  1   IAB      Ectopic      Multiple  0   Live Births  1           Past Medical History:  Diagnosis Date   Anemia    Blount's disease    GERD (gastroesophageal reflux disease)    Hx of chlamydia infection    Hx of gonorrhea    Hx of varicella    Hypertension    Miscarriage    Thalassemia trait     Past Surgical History:  Procedure Laterality Date   CESAREAN SECTION N/A 06/03/2015   Procedure: CESAREAN SECTION;  Surgeon: 06/05/2015, MD;  Location: [facility];  Service: Obstetrics;  Laterality: N/A;   LEG SURGERY     for Blount's?disease (bow leg)   TONSILLECTOMY      Family History  Problem Relation Age of Onset   Healthy Mother    Healthy Father     Social History   Tobacco Use   Smoking status: Never   Smokeless tobacco: Never  Vaping Use   Vaping Use: Never used  Substance Use Topics   Alcohol use: Yes    Comment: occasionally   Drug use: No    Allergies:  Allergies  Allergen Reactions   Oxycodone Itching   Shellfish Allergy Hives    Happened at age 61y, has not had shellfish since    No medications prior to admission.    Review of Systems  Constitutional:  Positive for chills. Negative for fever.  Gastrointestinal:  Positive for abdominal pain. Negative for constipation,  diarrhea, nausea and vomiting.  Genitourinary:  Positive for vaginal bleeding. Negative for dysuria, frequency and urgency.  Musculoskeletal:  Positive for back pain.  Physical Exam   Blood pressure (!) 148/84, pulse 86, temperature 99.6 F (37.6 C), resp. rate 16, height 5\' 7"  (1.702 m), weight 99.3 kg, last menstrual period 04/22/2021, SpO2 100 %. Patient Vitals for the past 24 hrs:  BP Temp Pulse Resp SpO2 Height Weight  05/23/21 1708 (!) 148/84 -- 86 -- -- -- --  05/23/21 1515 (!) 147/101 -- 84 16 100 % -- --  05/23/21 1459 (!) 157/84 -- 81 18 100 % 5\' 7"  (1.702 m) 99.3 kg  05/23/21 1358 (!) 159/107 99.6 F (37.6 C) 88 14 100 % -- --    Physical Exam Vitals and nursing note reviewed.  Constitutional:      General: She is not in acute distress.    Appearance: Normal appearance.  HENT:     Head: Normocephalic and atraumatic.  Cardiovascular:  Rate and Rhythm: Normal rate.  Pulmonary:     Effort: Pulmonary effort is normal. No respiratory distress.  Abdominal:     General: There is no distension.     Palpations: Abdomen is soft.     Tenderness: There is abdominal tenderness in the right lower quadrant, suprapubic area and left lower quadrant. There is no guarding.  Musculoskeletal:        General: Normal range of motion.     Cervical back: Normal range of motion.  Skin:    General: Skin is warm and dry.  Neurological:     General: No focal deficit present.     Mental Status: She is alert and oriented to person, place, and time.  Psychiatric:        Mood and Affect: Mood normal.        Behavior: Behavior normal.   Results for orders placed or performed during the hospital encounter of 05/23/21 (from the past 24 hour(s))  POC Urine Pregnancy, ED (not at [facility])     Status: Abnormal   Collection Time: 05/23/21  2:01 PM  Result Value Ref Range   Preg Test, Ur POSITIVE (A) NEGATIVE  Urinalysis, Routine w reflex microscopic Urine, Clean Catch     Status: None   Collection  Time: 05/23/21  3:07 PM  Result Value Ref Range   Color, Urine YELLOW YELLOW   APPearance CLEAR CLEAR   Specific Gravity, Urine 1.018 1.005 - 1.030   pH 5.0 5.0 - 8.0   Glucose, UA NEGATIVE NEGATIVE mg/dL   Hgb urine dipstick NEGATIVE NEGATIVE   Bilirubin Urine NEGATIVE NEGATIVE   Ketones, ur NEGATIVE NEGATIVE mg/dL   Protein, ur NEGATIVE NEGATIVE mg/dL   Nitrite NEGATIVE NEGATIVE   Leukocytes,Ua NEGATIVE NEGATIVE  CBC     Status: Abnormal   Collection Time: 05/23/21  3:19 PM  Result Value Ref Range   WBC 7.4 4.0 - 10.5 K/uL   RBC 4.71 3.87 - 5.11 MIL/uL   Hemoglobin 11.3 (L) 12.0 - 15.0 g/dL   HCT 81.0 (L) 17.5 - 10.2 %   MCV 75.8 (L) 80.0 - 100.0 fL   MCH 24.0 (L) 26.0 - 34.0 pg   MCHC 31.7 30.0 - 36.0 g/dL   RDW 58.5 27.7 - 82.4 %   Platelets 324 150 - 400 K/uL   nRBC 0.0 0.0 - 0.2 %  hCG, quantitative, pregnancy     Status: Abnormal   Collection Time: 05/23/21  3:19 PM  Result Value Ref Range   hCG, Beta Chain, Quant, S 584 (H) <5 mIU/mL  Wet prep, genital     Status: Abnormal   Collection Time: 05/23/21  3:34 PM   Specimen: PATH Cytology Cervicovaginal Ancillary Only  Result Value Ref Range   Yeast Wet Prep HPF POC NONE SEEN NONE SEEN   Trich, Wet Prep NONE SEEN NONE SEEN   Clue Cells Wet Prep HPF POC PRESENT (A) NONE SEEN   WBC, Wet Prep HPF POC FEW (A) NONE SEEN   Sperm NONE SEEN    US OB LESS THAN 14 WEEKS WITH OB TRANSVAGINAL  Result Date: 05/23/2021 CLINICAL DATA:  Vaginal bleeding EXAM: OBSTETRIC <14 WK [place] AND TRANSVAGINAL OB US TECHNIQUE: Both transabdominal and transvaginal ultrasound examinations were performed for complete evaluation of the gestation as well as the maternal uterus, adnexal regions, and pelvic cul-de-sac. Transvaginal technique was performed to assess early pregnancy. COMPARISON:  None. FINDINGS: Intrauterine gestational sac: None Yolk sac:  Not Visualized. Embryo:  Not  Visualized. Cardiac Activity: Not Visualized. Heart Rate:   bpm MSD:   mm     w     d CRL:    mm    w    d                  [place] EDC: Subchorionic hemorrhage:  None visualized. Maternal uterus/adnexae: Endometrium is thickened and heterogeneous measuring up to 29 mm. Small fibroids measure up to 2.4 cm. IMPRESSION: No intrauterine gestation visualized. Thickened, heterogeneous endometrium measuring up to 29 mm. This could reflect blood products. Electronically Signed   By: Charlett Nose M.D.   On: 05/23/2021 16:11    MAU Course  Procedures  MDM Labs and [place] ordered and reviewed. No IUGS YS or FP seen on [place], findings could indicate early pregnancy, ectopic pregnancy, or failed pregnancy, discussed with pt. Will follow quant in 48 hrs. Consult with Dr. Alysia Penna. Will switch to Labetalol. Stable for discharge home.   Assessment and Plan   1. Pregnancy, location unknown   2. Vaginal bleeding in pregnancy   3. Blood type, Rh positive   4. Chronic hypertension affecting pregnancy    Discharge home Follow up at [facility] on 05/26/21 Ectopic/SAB precautions Rx Labetalol  Allergies as of 05/23/2021       Reactions   Oxycodone Itching   Shellfish Allergy Hives   Happened at age 49y, has not had shellfish since        Medication List     STOP taking these medications    amLODipine 5 MG tablet Commonly known as: NORVASC   cyclobenzaprine 10 MG tablet Commonly known as: FLEXERIL   FERROUS SULFATE PO   ibuprofen 600 MG tablet Commonly known as: ADVIL   norethindrone-ethinyl estradiol 1-20 MG-MCG tablet Commonly known as: LOESTRIN   predniSONE 20 MG tablet Commonly known as: DELTASONE   PRESCRIPTION MEDICATION       TAKE these medications    labetalol 200 MG tablet Commonly known as: NORMODYNE Take 1 tablet (200 mg total) by mouth 2 (two) times daily. What changed:  medication strength how much to take when to take this       Destiny Jones, CNM 05/23/2021, 5:36 PM

## 2021-05-23 NOTE — MAU Note (Signed)
Started bleeding 3 days ago, was like a period. The next day it was lighter, just spotting. Cramping  started 3 days ago and has gotten worse.  +HPT last night.

## 2021-05-23 NOTE — ED Provider Notes (Signed)
Emergency Medicine Provider OB Triage Evaluation Note  Destiny Jones is a 35 y.o. female, G2P1011, at Unknown gestation who presents to the emergency department with complaints of vaginal bleeding and pelvic pain.  Patient reports that her symptoms started 3 days prior.  Bleeding started heavy requiring a pad/tampon to be changed every 1.5hr.  Since then it has become spotting.   patient reports that pelvic pain is constant and waxes and wanes in intensity.  Patient describes pain as a cramping.  Patient reports taking a home pregnancy test which was positive.  She is G3P1011.  LMP 6/14  Review of  Systems  Positive: Pelvic pain, vaginal bleeding Negative: Nausea, vomiting, dysuria, vaginal pain, vaginal discharge  Physical Exam  BP (!) 159/107   Pulse 88   Temp 99.6 F (37.6 C)   Resp 14   SpO2 100%  General: Awake, no distress  HEENT: Atraumatic  Resp: Normal effort  Cardiac: Normal rate Abd: Nondistended, soft, tenderness to suprapubic area MSK: Moves all extremities without difficulty Neuro: Speech clear  Medical Decision Making  Pt evaluated for pregnancy concern and is stable for transfer to MAU. Pt is in agreement with plan for transfer.  2:16 PM Discussed with MAU APP, Shawna Orleans , who accepts patient in transfer.  Clinical Impression  No diagnosis found.     Haskel Schroeder, PA-C 05/23/21 1419    Melene Plan, DO 05/23/21 1624

## 2021-05-23 NOTE — ED Triage Notes (Addendum)
Patient presents to Urgent Care with complaints of abdominal cramping and intermittent vaginal bleeding x 3 days. She states initial bleeding was heavy and bleeding now is scant amount. She states she had a positive at home pregnancy test yesterday. She has a hx of miscarriage. She is currently her daily hypertension medications.

## 2021-05-26 ENCOUNTER — Ambulatory Visit (INDEPENDENT_AMBULATORY_CARE_PROVIDER_SITE_OTHER): Payer: 59

## 2021-05-26 ENCOUNTER — Other Ambulatory Visit: Payer: Self-pay

## 2021-05-26 ENCOUNTER — Other Ambulatory Visit: Payer: Self-pay | Admitting: Obstetrics and Gynecology

## 2021-05-26 VITALS — BP 128/92 | HR 72 | Ht 66.0 in | Wt 218.0 lb

## 2021-05-26 DIAGNOSIS — O4690 Antepartum hemorrhage, unspecified, unspecified trimester: Secondary | ICD-10-CM

## 2021-05-26 DIAGNOSIS — O3680X0 Pregnancy with inconclusive fetal viability, not applicable or unspecified: Secondary | ICD-10-CM

## 2021-05-26 LAB — BETA HCG QUANT (REF LAB): hCG Quant: 1609 m[IU]/mL

## 2021-05-26 LAB — GC/CHLAMYDIA PROBE AMP (~~LOC~~) NOT AT ARMC
Chlamydia: NEGATIVE
Comment: NEGATIVE
Comment: NORMAL
Neisseria Gonorrhea: NEGATIVE

## 2021-05-26 NOTE — Progress Notes (Signed)
Results are back. Reviewed results with Dr Alysia Penna. Advised normal results and to have follow up OB U/S around [redacted] weeks gestation.   Call placed to pt. Spoke with pt. Pt given results. Pt agreeable to plan of care. Ectopic and heavy bleeding/severe abd pain precautions discussed and when to return to MAU.   Pt scheduled for repeat OB US on 8/15 at 11am at [facility]. Pt agreeable to date and time of appt.   Judeth Cornfield, RN

## 2021-05-26 NOTE — Progress Notes (Signed)
Pt here today for STAT Beta from MAU follow up on 05/23/21.   Pt denies any vaginal bleeding today. Pt states having mild cramps. States spotting stopped yesterday. Pt advised after results come back from Beta, will be contacted via phone with plan of care per Dr Alysia Penna. Pt verbalized understanding.  Hx of SAB in past.   Pt has elevated BP at office today. Pt just had BP meds changed at ER on 05/23/21 to Labetalol 200 mg BID. Pt states only started taking new dose yesterday. Pt has PCP appt for follow up 05/27/21.  BP: 128/92  Judeth Cornfield, RN

## 2021-05-26 NOTE — Progress Notes (Signed)
Agree with A & P. 

## 2021-06-23 ENCOUNTER — Ambulatory Visit: Admission: RE | Admit: 2021-06-23 | Payer: 59 | Source: Ambulatory Visit

## 2022-11-20 LAB — OB RESULTS CONSOLE RPR: RPR: NONREACTIVE

## 2022-11-20 LAB — OB RESULTS CONSOLE ANTIBODY SCREEN: Antibody Screen: NEGATIVE

## 2022-11-20 LAB — OB RESULTS CONSOLE ABO/RH: RH Type: POSITIVE

## 2022-11-20 LAB — OB RESULTS CONSOLE RUBELLA ANTIBODY, IGM: Rubella: IMMUNE

## 2022-11-20 LAB — HEPATITIS C ANTIBODY: HCV Ab: NEGATIVE

## 2022-11-20 LAB — OB RESULTS CONSOLE HIV ANTIBODY (ROUTINE TESTING): HIV: NONREACTIVE

## 2022-11-20 LAB — OB RESULTS CONSOLE GBS: GBS: POSITIVE

## 2022-11-20 LAB — OB RESULTS CONSOLE HEPATITIS B SURFACE ANTIGEN: Hepatitis B Surface Ag: NEGATIVE

## 2023-01-06 ENCOUNTER — Non-Acute Institutional Stay (HOSPITAL_COMMUNITY)
Admission: RE | Admit: 2023-01-06 | Discharge: 2023-01-06 | Disposition: A | Discharge status: Home / Self Care | Payer: 59 | Source: Ambulatory Visit | Attending: Internal Medicine | Admitting: Internal Medicine

## 2023-01-06 DIAGNOSIS — O99019 Anemia complicating pregnancy, unspecified trimester: Secondary | ICD-10-CM | POA: Insufficient documentation

## 2023-01-06 MED ORDER — SODIUM CHLORIDE 0.9 % IV SOLN
510.0000 mg | Freq: Once | INTRAVENOUS | Status: AC
  Administered 2023-01-06: 510 mg via INTRAVENOUS
  Filled 2023-01-06: qty 510

## 2023-01-06 MED ORDER — SODIUM CHLORIDE 0.9 % IV SOLN: INTRAVENOUS | Status: DC | PRN

## 2023-01-06 NOTE — Progress Notes (Signed)
PATIENT CARE CENTER NOTE  Diagnosis: A999333 Anemia complicating pregnancy, unspecified trimester    Provider: Allyn Kenner, D.O.   Procedure: Feraheme 510 mg (#1 of 2)  Note: Patient received IV Feraheme infusion. Pt tolerated infusion without any adverse effects. Pt observed for 30 minutes post infusion. AVS given and reviewed with pt. Pt to come back for second dose next week, and is advised to schedule at front desk. Pt is alert, oriented and ambulatory at discharge.

## 2023-01-13 ENCOUNTER — Non-Acute Institutional Stay (HOSPITAL_COMMUNITY)
Admission: RE | Admit: 2023-01-13 | Discharge: 2023-01-13 | Disposition: A | Discharge status: Home / Self Care | Payer: 59 | Source: Ambulatory Visit | Attending: Internal Medicine | Admitting: Internal Medicine

## 2023-01-13 DIAGNOSIS — D649 Anemia, unspecified: Secondary | ICD-10-CM | POA: Diagnosis not present

## 2023-01-13 DIAGNOSIS — O99019 Anemia complicating pregnancy, unspecified trimester: Secondary | ICD-10-CM | POA: Insufficient documentation

## 2023-01-13 MED ORDER — SODIUM CHLORIDE 0.9 % IV SOLN
510.0000 mg | Freq: Once | INTRAVENOUS | Status: AC
  Administered 2023-01-13: 510 mg via INTRAVENOUS
  Filled 2023-01-13: qty 17

## 2023-01-13 MED ORDER — SODIUM CHLORIDE 0.9 % IV SOLN: INTRAVENOUS | Status: DC | PRN

## 2023-01-13 NOTE — Progress Notes (Signed)
PATIENT CARE CENTER NOTE  Diagnosis: A999333 Anemia complicating pregnancy, unspecified trimester     Provider: Allyn Kenner, D.O.  Procedure: Feraheme 510 mg (#2 of 2)  Note: Patient received IV Feraheme 510 mg infusion via PIV. Pt tolerated infusion with no adverse effects. Pt observed for 30 minutes post infusion. AVS offered, but pt declined. Pt is alert, oriented, and ambulatory at discharge.

## 2023-01-25 ENCOUNTER — Inpatient Hospital Stay (HOSPITAL_COMMUNITY)
Admission: AD | Admit: 2023-01-25 | Discharge: 2023-01-25 | Disposition: A | Discharge status: Home / Self Care | Payer: 59 | Attending: Obstetrics | Admitting: Obstetrics

## 2023-01-25 ENCOUNTER — Inpatient Hospital Stay (HOSPITAL_BASED_OUTPATIENT_CLINIC_OR_DEPARTMENT_OTHER): Payer: 59

## 2023-01-25 ENCOUNTER — Encounter (HOSPITAL_COMMUNITY): Payer: Self-pay | Admitting: *Deleted

## 2023-01-25 DIAGNOSIS — O99891 Other specified diseases and conditions complicating pregnancy: Secondary | ICD-10-CM

## 2023-01-25 DIAGNOSIS — O99212 Obesity complicating pregnancy, second trimester: Secondary | ICD-10-CM | POA: Diagnosis not present

## 2023-01-25 DIAGNOSIS — O418X20 Other specified disorders of amniotic fluid and membranes, second trimester, not applicable or unspecified: Secondary | ICD-10-CM

## 2023-01-25 DIAGNOSIS — Z3A17 17 weeks gestation of pregnancy: Secondary | ICD-10-CM

## 2023-01-25 DIAGNOSIS — O3482 Maternal care for other abnormalities of pelvic organs, second trimester: Secondary | ICD-10-CM | POA: Diagnosis not present

## 2023-01-25 DIAGNOSIS — R1030 Lower abdominal pain, unspecified: Secondary | ICD-10-CM | POA: Diagnosis not present

## 2023-01-25 DIAGNOSIS — O3412 Maternal care for benign tumor of corpus uteri, second trimester: Secondary | ICD-10-CM | POA: Insufficient documentation

## 2023-01-25 DIAGNOSIS — O208 Other hemorrhage in early pregnancy: Secondary | ICD-10-CM | POA: Insufficient documentation

## 2023-01-25 DIAGNOSIS — O09522 Supervision of elderly multigravida, second trimester: Secondary | ICD-10-CM | POA: Diagnosis not present

## 2023-01-25 DIAGNOSIS — D259 Leiomyoma of uterus, unspecified: Secondary | ICD-10-CM

## 2023-01-25 DIAGNOSIS — O10912 Unspecified pre-existing hypertension complicating pregnancy, second trimester: Secondary | ICD-10-CM | POA: Insufficient documentation

## 2023-01-25 DIAGNOSIS — O99012 Anemia complicating pregnancy, second trimester: Secondary | ICD-10-CM | POA: Diagnosis not present

## 2023-01-25 DIAGNOSIS — O10012 Pre-existing essential hypertension complicating pregnancy, second trimester: Secondary | ICD-10-CM

## 2023-01-25 DIAGNOSIS — O3410 Maternal care for benign tumor of corpus uteri, unspecified trimester: Secondary | ICD-10-CM

## 2023-01-25 DIAGNOSIS — O26892 Other specified pregnancy related conditions, second trimester: Secondary | ICD-10-CM | POA: Diagnosis not present

## 2023-01-25 DIAGNOSIS — E669 Obesity, unspecified: Secondary | ICD-10-CM

## 2023-01-25 DIAGNOSIS — N83201 Unspecified ovarian cyst, right side: Secondary | ICD-10-CM

## 2023-01-25 DIAGNOSIS — O468X2 Other antepartum hemorrhage, second trimester: Secondary | ICD-10-CM

## 2023-01-25 DIAGNOSIS — IMO0001 Reserved for inherently not codable concepts without codable children: Secondary | ICD-10-CM

## 2023-01-25 LAB — CBC
HCT: 31.2 % — ABNORMAL LOW (ref 36.0–46.0)
Hemoglobin: 9.9 g/dL — ABNORMAL LOW (ref 12.0–15.0)
MCH: 23.7 pg — ABNORMAL LOW (ref 26.0–34.0)
MCHC: 31.7 g/dL (ref 30.0–36.0)
MCV: 74.6 fL — ABNORMAL LOW (ref 80.0–100.0)
Platelets: 215 10*3/uL (ref 150–400)
RBC: 4.18 MIL/uL (ref 3.87–5.11)
RDW: 18.9 % — ABNORMAL HIGH (ref 11.5–15.5)
WBC: 7.4 10*3/uL (ref 4.0–10.5)
nRBC: 0 % (ref 0.0–0.2)

## 2023-01-25 LAB — URINALYSIS, ROUTINE W REFLEX MICROSCOPIC
Bilirubin Urine: NEGATIVE
Glucose, UA: NEGATIVE mg/dL
Hgb urine dipstick: NEGATIVE
Ketones, ur: NEGATIVE mg/dL
Leukocytes,Ua: NEGATIVE
Nitrite: NEGATIVE
Protein, ur: NEGATIVE mg/dL
Specific Gravity, Urine: 1.012 (ref 1.005–1.030)
pH: 6 (ref 5.0–8.0)

## 2023-01-25 LAB — WET PREP, GENITAL
Sperm: NONE SEEN
Trich, Wet Prep: NONE SEEN
WBC, Wet Prep HPF POC: >=10 — AB (ref <10)
Yeast Wet Prep HPF POC: NONE SEEN

## 2023-01-25 MED ORDER — IBUPROFEN 600 MG PO TABS: 600.0000 mg | ORAL_TABLET | Freq: Four times a day (QID) | ORAL | 0 refills | Status: DC | PRN

## 2023-01-25 MED ORDER — KETOROLAC TROMETHAMINE 30 MG/ML IJ SOLN
30.0000 mg | Freq: Once | INTRAMUSCULAR | Status: AC
  Administered 2023-01-25: 30 mg via INTRAMUSCULAR
  Filled 2023-01-25: qty 1

## 2023-01-25 NOTE — MAU Note (Signed)
Destiny Jones is a 37 y.o. at [redacted]w[redacted]d here in MAU reporting: yesterday had a little cramping in lower rt side. Now is affecting back. Was coming and going last night, only got about 2 hrs of sleep.  Is more "consistant" now. No bleeding or d/c.  LMP: 1119 Onset of complaint: yesterday Pain score: 5 Vitals:   01/25/23 0935  BP: 128/86  Pulse: 92  Resp: 18  Temp: 99.2 F (37.3 C)  SpO2: 100%     FHT:148 Lab orders placed from triage:  urine  Has been constipated, has been taking Miralax.  Last BM was 3/17.  On medication for HTN.

## 2023-01-25 NOTE — MAU Provider Note (Addendum)
History     CSN: [id number]  Arrival date and time: 01/25/23 0854   Event Date/Time   First Provider Initiated Contact with Patient 01/25/23 1021      Chief Complaint  Patient presents with   Abdominal Pain   Back Pain   37 y.o. G4P1011 @17 .1 wks presenting with abdominal pain. Reports onset last night. Describes pain as bilateral lower cramping that is constant but worsens intermittently. Pain wraps to right side of her back. Denies VB or vaginal discharge. Denies urinary sx.    OB History     Gravida  4   Para  1   Term  1   Preterm      AB  1   Living  1      SAB  1   IAB      Ectopic      Multiple  0   Live Births  1           Past Medical History:  Diagnosis Date   Anemia    Blount's disease    GERD (gastroesophageal reflux disease)    Hx of chlamydia infection    Hx of gonorrhea    Hx of varicella    Hypertension    Miscarriage    Thalassemia trait     Past Surgical History:  Procedure Laterality Date   CESAREAN SECTION N/A 06/03/2015   Procedure: CESAREAN SECTION;  Surgeon: Everett Graff, MD;  Location: [place] [facility];  Service: Obstetrics;  Laterality: N/A;   LEG SURGERY     for Blount's?disease (bow leg)   TONSILLECTOMY      History reviewed. No pertinent family history.  Social History   Tobacco Use   Smoking status: Never   Smokeless tobacco: Never  Vaping Use   Vaping Use: Never used  Substance Use Topics   Alcohol use: Yes    Comment: occasionally   Drug use: No    Allergies:  Allergies  Allergen Reactions   Oxycodone-Acetaminophen Itching   Oxycodone Itching   Shellfish-Derived Products Hives    No medications prior to admission.    Review of Systems  Constitutional:  Negative for chills and fever.  Gastrointestinal:  Positive for constipation and nausea. Negative for abdominal pain and vomiting.  Genitourinary: Negative.  Negative for vaginal bleeding and vaginal discharge.  Musculoskeletal:  Positive for  back pain.   Physical Exam   Blood pressure (!) 149/83, pulse 84, temperature 98.4 F (36.9 C), temperature source Oral, resp. rate 18, height 5\' 7"  (1.702 m), weight 98.5 kg, last menstrual period 09/27/2022, SpO2 100 %, unknown if currently breastfeeding. Patient Vitals for the past 24 hrs:  BP Temp Temp src Pulse Resp SpO2 Height Weight  01/25/23 1209 (!) 149/83 98.4 F (36.9 C) Oral 84 18 100 % -- --  01/25/23 1016 120/75 98.5 F (36.9 C) Oral 84 20 100 % -- --  01/25/23 0935 128/86 99.2 F (37.3 C) Oral 92 18 100 % 5\' 7"  (1.702 m) 98.5 kg   Physical Exam Vitals and nursing note reviewed. Exam conducted with a chaperone present.  Constitutional:      General: She is not in acute distress.    Appearance: Normal appearance.  HENT:     Head: Normocephalic and atraumatic.  Cardiovascular:     Rate and Rhythm: Normal rate.  Pulmonary:     Effort: Pulmonary effort is normal. No respiratory distress.  Abdominal:     Palpations: Abdomen is soft.  Tenderness: There is abdominal tenderness in the suprapubic area.  Musculoskeletal:     Cervical back: Normal range of motion.  Neurological:     Mental Status: She is alert.   FHT: 148  Results for orders placed or performed during the hospital encounter of 01/25/23 (from the past 24 hour(s))  Urinalysis, Routine w reflex microscopic -Urine, Clean Catch     Status: Abnormal   Collection Time: 01/25/23 10:12 AM  Result Value Ref Range   Color, Urine YELLOW YELLOW   APPearance HAZY (A) CLEAR   Specific Gravity, Urine 1.012 1.005 - 1.030   pH 6.0 5.0 - 8.0   Glucose, UA NEGATIVE NEGATIVE mg/dL   Hgb urine dipstick NEGATIVE NEGATIVE   Bilirubin Urine NEGATIVE NEGATIVE   Ketones, ur NEGATIVE NEGATIVE mg/dL   Protein, ur NEGATIVE NEGATIVE mg/dL   Nitrite NEGATIVE NEGATIVE   Leukocytes,Ua NEGATIVE NEGATIVE  Wet prep, genital     Status: Abnormal   Collection Time: 01/25/23 10:30 AM   Specimen: PATH Cytology Cervicovaginal  Ancillary Only  Result Value Ref Range   Yeast Wet Prep HPF POC NONE SEEN NONE SEEN   Trich, Wet Prep NONE SEEN NONE SEEN   Clue Cells Wet Prep HPF POC PRESENT (A) NONE SEEN   WBC, Wet Prep HPF POC >=10 (A) <10   Sperm NONE SEEN   CBC     Status: Abnormal   Collection Time: 01/25/23 10:58 AM  Result Value Ref Range   WBC 7.4 4.0 - 10.5 K/uL   RBC 4.18 3.87 - 5.11 MIL/uL   Hemoglobin 9.9 (L) 12.0 - 15.0 g/dL   HCT 31.2 (L) 36.0 - 46.0 %   MCV 74.6 (L) 80.0 - 100.0 fL   MCH 23.7 (L) 26.0 - 34.0 pg   MCHC 31.7 30.0 - 36.0 g/dL   RDW 18.9 (H) 11.5 - 15.5 %   Platelets 215 150 - 400 K/uL   nRBC 0.0 0.0 - 0.2 %    [place] MFM OB Limited  Result Date: 01/25/2023 ----------------------------------------------------------------------  OBSTETRICS REPORT                       (Signed Final 01/25/2023 12:34 pm) ---------------------------------------------------------------------- Patient Info  ID #:       [id number]                          D.O.B.:  16-Oct-1986 (36 yrs)  Name:       Destiny Jones                   Visit Date: 01/25/2023 10:44 am ---------------------------------------------------------------------- Performed By  Attending:        Tama High MD        Referred By:      [facility] MAU/Triage  Performed By:     Valda Favia          [facility] ---------------------------------------------------------------------- Orders  #  Description  Code        Ordered By  1  [place] MFM OB LIMITED                     [id number]    Xochitl Egle ----------------------------------------------------------------------  #  Order #                     Accession #                Episode #  1  [id number]                   [id number]                 [id number] ---------------------------------------------------------------------- Indications  [redacted] weeks gestation of pregnancy                Z3A.17  Abdominal pain  in pregnancy                    O99.89  Advanced maternal age multigravida 28+,        O76.522  second trimester  Obesity complicating pregnancy, second         O99.212  trimester  Hypertension - Chronic/Pre-existing            O10.019  Uterine fibroids affecting pregnancy in        O34.12, D25.9  second trimester, antepartum ---------------------------------------------------------------------- Fetal Evaluation  Num Of Fetuses:         1  Fetal Heart Rate(bpm):  149  Cardiac Activity:       Observed  Presentation:           Cephalic  Placenta:               Anterior  P. Cord Insertion:      Not well visualized  Amniotic Fluid  AFI FV:      Within normal limits  Comment:    Small subchorionic hemorrhage noted. ---------------------------------------------------------------------- OB History  Gravidity:    4         Term:   1        Prem:   0        SAB:   2  TOP:          0       Ectopic:  0        Living: 1 ---------------------------------------------------------------------- Gestational Age  LMP:           17w 1d        Date:  09/27/22                 EDD:   07/04/23  Best:          17w 1d     Det. By:  LMP  (09/27/22)          EDD:   07/04/23 ---------------------------------------------------------------------- Anatomy  Cranium:               Appears normal         Stomach:                Appears normal, left  sided  Thoracic:              Appears normal ---------------------------------------------------------------------- Cervix Uterus Adnexa  Cervix  Closed  Uterus  Multiple fibroids noted, see table below.  Right Ovary  Ovarian cyst measuring 5.6 x 5.3 x 4.8 cm. Arterial and venous flow  documented.  Left Ovary  No adnexal mass visualized.  Cul De Sac  No free fluid seen.  Adnexa  No abnormality visualized ---------------------------------------------------------------------- Myomas  Site                     L(cm)      W(cm)      D(cm)        Location  Posterior                4.5        4          4.2  Right                    4.6        4.2        4.8  Right                    4.9        4.8        5.3 ----------------------------------------------------------------------  Blood Flow                  RI       PI       Comments ---------------------------------------------------------------------- Impression  Patient was evaluated the MAU for complaints of lower  abdominal pain.  A limited ultrasound study was performed.  Amniotic fluid is  normal and good fetal activity seen.  Multiple myomas are  seen (measurements above).  Right ovarian cyst with multiple  septae, measuring 5.6 x 5.3 x 4.8 cm is seen.  Color flow did  not show increased vascularity.  Her pain is more likely from myomas. ----------------------------------------------------------------------                 Tama High, MD Electronically Signed Final Report   01/25/2023 12:34 pm ----------------------------------------------------------------------   MAU Course  Procedures Toradol  MDM Chart reviewed: pregnancy complicated by AMA, CHTN on Labetalol, anemia, uterine fibroids, previous CS, GBS carrier, and umbilical hernia.  No signs of threatened SAB or UTI. Pain improved. Fibroids and cysts likely cause of pain. Discussed comfort measures. Stable for discharge home.  Assessment and Plan   1. [redacted] weeks gestation of pregnancy   2. Anemia affecting pregnancy in second trimester   3. Cyst of right ovary   4. Uterine fibroid in pregnancy   5. Subchorionic hemorrhage of placenta in second trimester, single or unspecified fetus    Discharge home [facility] up at [facility] as scheduled SAB precautions Rx Ibuprofen  Allergies as of 01/25/2023       Reactions   Oxycodone-acetaminophen Itching   Oxycodone Itching   Shellfish-derived Products Hives        Medication List     STOP taking these medications    albuterol 108 (90 Base) MCG/ACT inhaler Commonly  known as: VENTOLIN HFA   Butalbital-APAP-Caffeine 50-300-40 MG Caps   diazepam 5 MG tablet Commonly known as: VALIUM       TAKE these medications    EPINEPHrine 0.3 mg/0.3 mL Soaj injection Commonly known as: EPI-PEN Inject into the muscle.   hydrOXYzine 25 MG tablet Commonly known  as: ATARAX Take 1 tablet by mouth 3 (three) times daily as needed.   ibuprofen 600 MG tablet Commonly known as: ADVIL Take 1 tablet (600 mg total) by mouth every 6 (six) hours as needed. Do not use after 28 weeks of pregnancy   labetalol 200 MG tablet Commonly known as: NORMODYNE Take 1 tablet (200 mg total) by mouth 2 (two) times daily. What changed:  how much to take when to take this   multivitamin-prenatal 27-0.8 MG Tabs tablet Take 1 tablet by mouth daily at 12 noon.   ondansetron 4 MG disintegrating tablet Commonly known as: ZOFRAN-ODT Zofran ODT 4 mg disintegrating tablet  Take 2 tablets 3 times a day by oral route before meals for 30 days.        Julianne Handler, CNM 01/25/2023, 12:27 PM

## 2023-01-26 LAB — GC/CHLAMYDIA PROBE AMP (~~LOC~~) NOT AT ARMC
Chlamydia: NEGATIVE
Comment: NEGATIVE
Comment: NORMAL
Neisseria Gonorrhea: NEGATIVE

## 2023-03-18 IMAGING — US US OB < 14 WEEKS - US OB TV
1 series · 15 of 28 positions shown · non-contrast
Comparison: None.

CLINICAL DATA: Vaginal bleeding

EXAM:
OBSTETRIC <14 WK US AND TRANSVAGINAL OB US
TECHNIQUE: Both transabdominal and transvaginal ultrasound examinations were
performed for complete evaluation of the gestation as well as the
maternal uterus, adnexal regions, and pelvic cul-de-sac.
Transvaginal technique was performed to assess early pregnancy.

[Series 1: us ob < 14 weeks - us ob tv · 57 acquisitions, 15 frames shown]
[im 1/57]
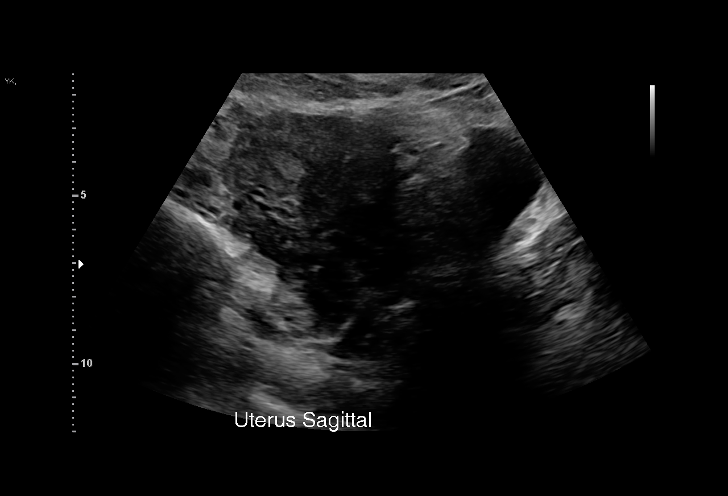
[im 5/57]
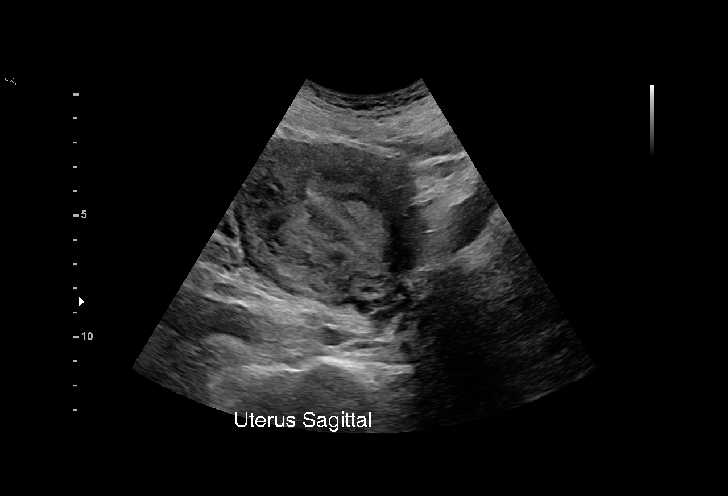
[im 9/57]
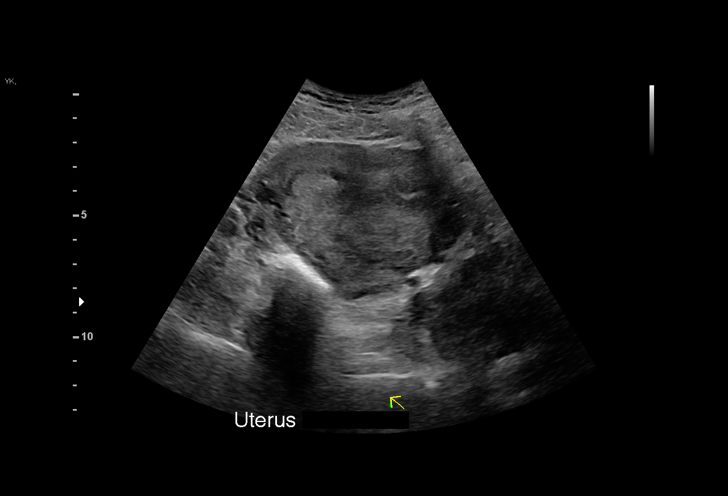
[im 13/57]
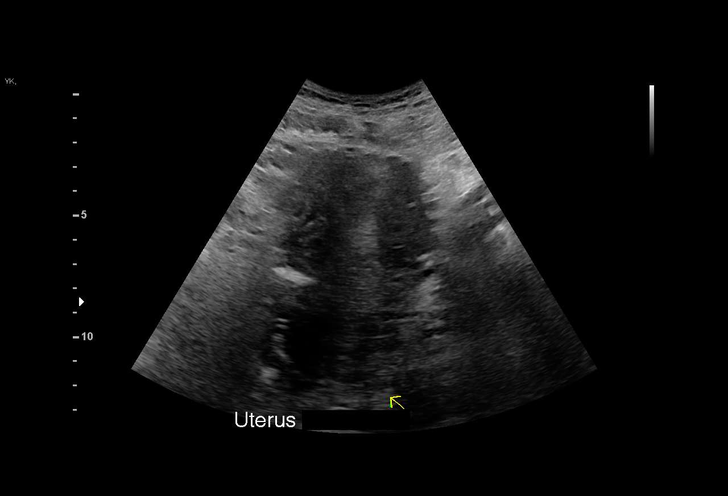
[im 17/57]
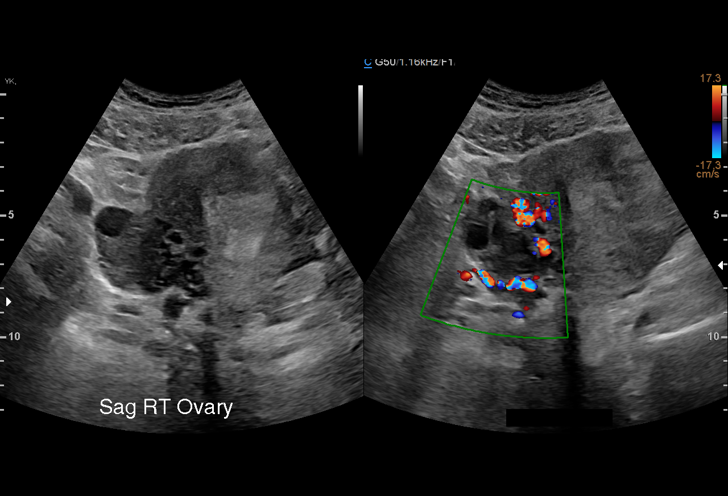
[im 21/57]
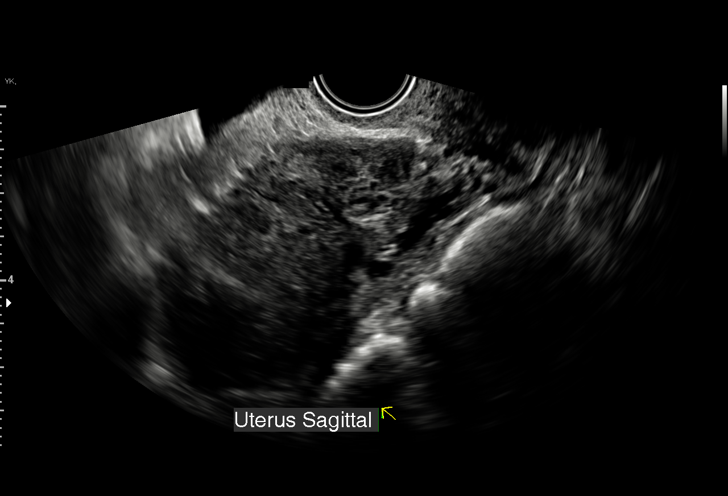
[im 25/57]
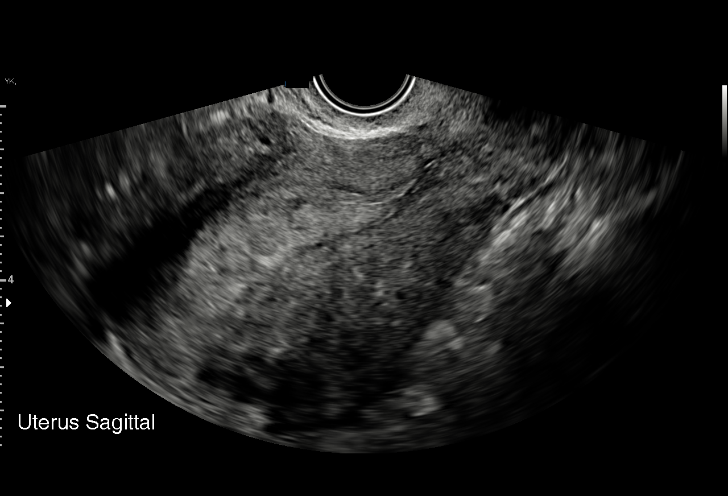
[im 30/57]
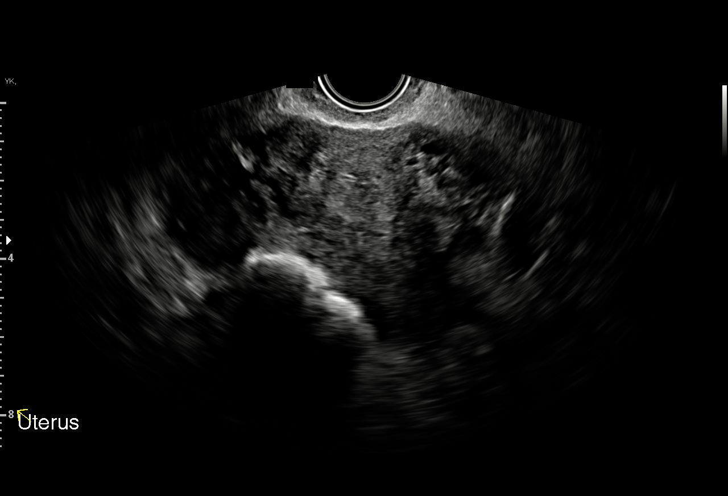
[im 32/57]
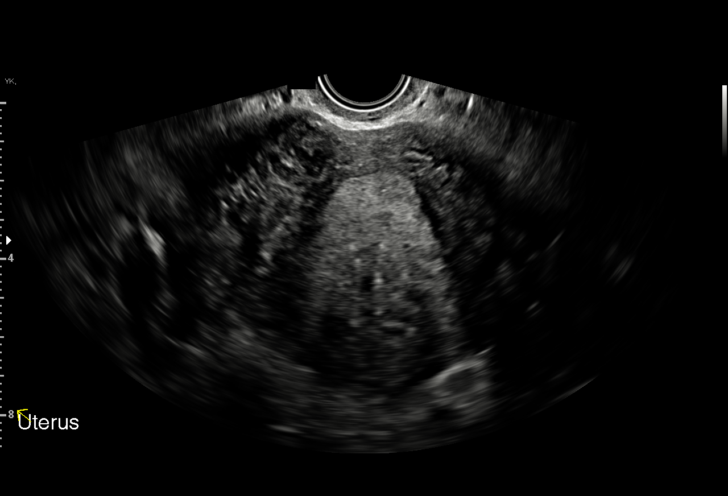
[im 36/57]
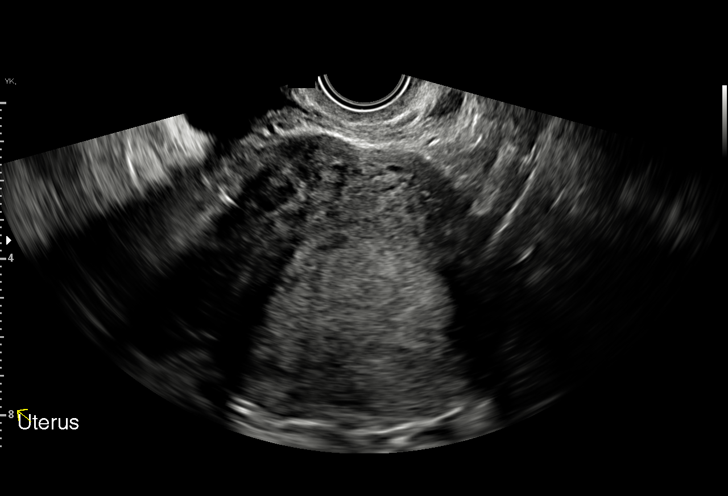
[im 40/57]
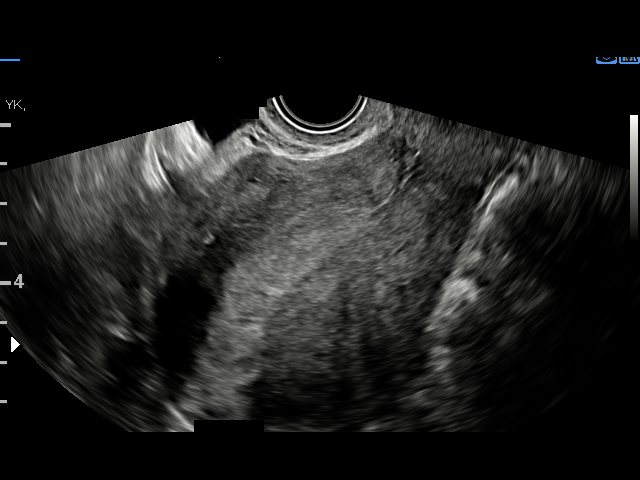
[im 44/57]
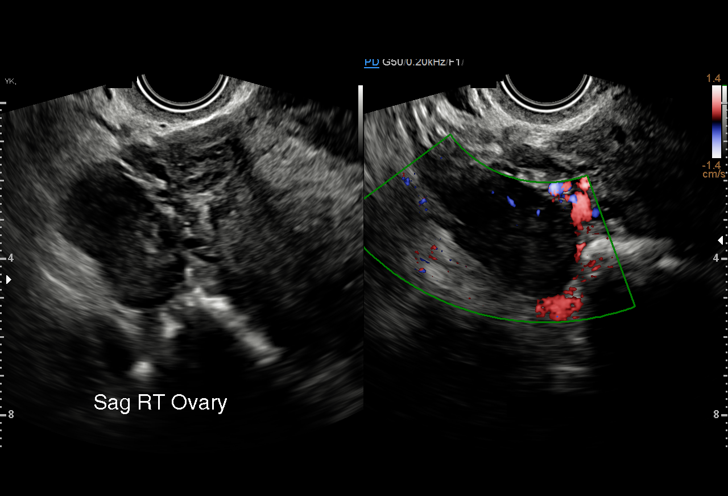
[im 48/57]
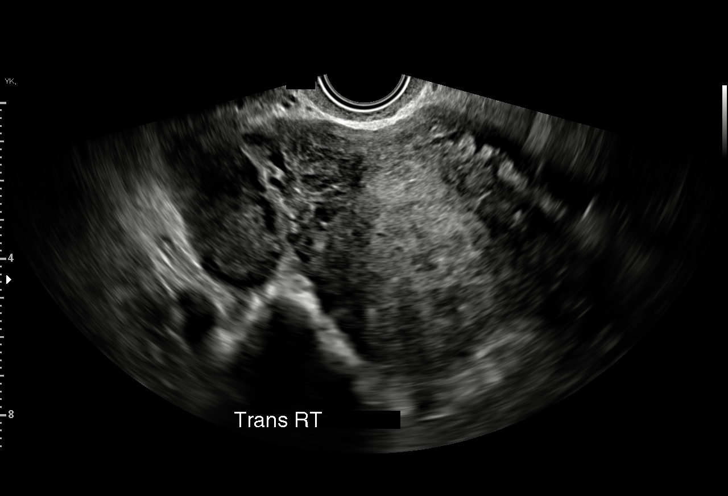
[im 52/57]
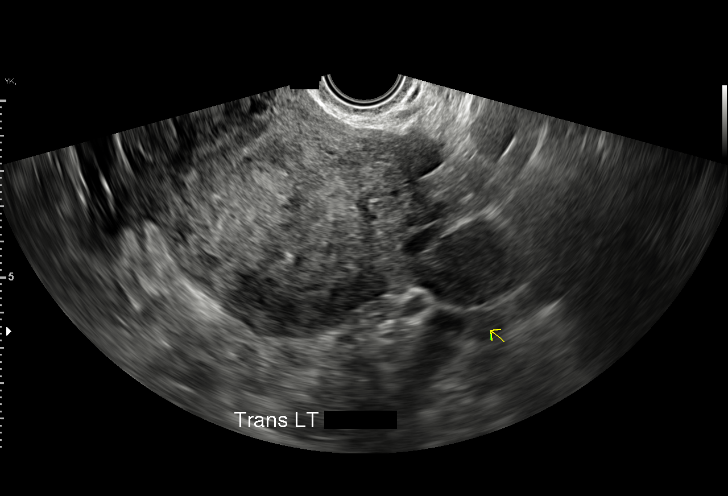
[im 57/57]
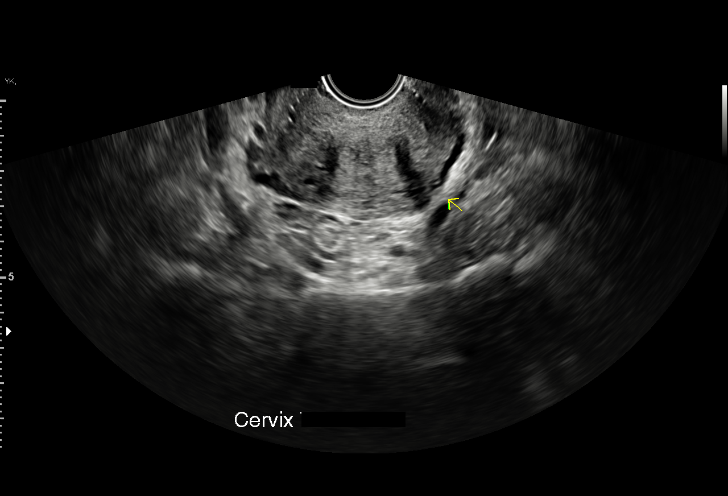

[15 of 28 positions shown; findings below may reference images not displayed]

FINDINGS: Intrauterine gestational sac: None

Yolk sac:  Not Visualized.

Embryo:  Not Visualized.

Cardiac Activity: Not Visualized.

Heart Rate:   bpm

MSD:   mm    w     d

CRL:    mm    w    d                  US EDC:

Subchorionic hemorrhage:  None visualized.

Maternal uterus/adnexae: Endometrium is thickened and heterogeneous
measuring up to 29 mm. Small fibroids measure up to 2.4 cm.
IMPRESSION: No intrauterine gestation visualized. Thickened, heterogeneous
endometrium measuring up to 29 mm. This could reflect blood
products.

## 2023-05-11 ENCOUNTER — Other Ambulatory Visit: Payer: Self-pay | Admitting: Obstetrics and Gynecology

## 2023-06-21 ENCOUNTER — Encounter (HOSPITAL_COMMUNITY): Payer: Self-pay

## 2023-06-21 ENCOUNTER — Telehealth (HOSPITAL_COMMUNITY): Payer: Self-pay | Admitting: *Deleted

## 2023-06-21 NOTE — Telephone Encounter (Signed)
Preadmission screen  

## 2023-06-21 NOTE — Patient Instructions (Addendum)
Waynette Kelman  06/21/2023   Your procedure is scheduled on:  07/05/2023  Arrive at 0530 at Entrance C on [employer] at [facility]  and [employer]. You are invited to use the FREE valet parking or use the Visitor's parking deck.  Pick up the phone at the desk and dial [phone].  Call this number if you have problems the morning of surgery: [phone]  Remember:   Do not eat food:(After Midnight) Desps de medianoche.  Do not drink clear liquids: (After Midnight) Desps de medianoche.  Take these medicines the morning of surgery with A SIP OF WATER:  none   Do not wear jewelry, make-up or nail polish.  Do not wear lotions, powders, or perfumes. Do not wear deodorant.  Do not shave 48 hours prior to surgery.  Do not bring valuables to the hospital.  [facility] is not   responsible for any belongings or valuables brought to the hospital.  Contacts, dentures or bridgework may not be worn into surgery.  Leave suitcase in the car. After surgery it may be brought to your room.  For patients admitted to the hospital, checkout time is 11:00 AM the day of              discharge.      Please read over the following fact sheets that you were given:     Preparing for Surgery

## 2023-06-22 ENCOUNTER — Telehealth (HOSPITAL_COMMUNITY): Payer: Self-pay | Admitting: *Deleted

## 2023-06-22 NOTE — Telephone Encounter (Signed)
Preadmission screen  

## 2023-06-23 ENCOUNTER — Telehealth (HOSPITAL_COMMUNITY): Payer: Self-pay | Admitting: *Deleted

## 2023-06-23 NOTE — Telephone Encounter (Signed)
Preadmission screen  

## 2023-06-25 ENCOUNTER — Telehealth (HOSPITAL_COMMUNITY): Payer: Self-pay | Admitting: *Deleted

## 2023-06-25 NOTE — Telephone Encounter (Signed)
Preadmission screen  

## 2023-06-28 ENCOUNTER — Encounter (HOSPITAL_COMMUNITY): Payer: Self-pay

## 2023-07-02 ENCOUNTER — Inpatient Hospital Stay (HOSPITAL_COMMUNITY)
Admission: RE | Admit: 2023-07-02 | Payer: Medicaid Other | Source: Home / Self Care | Admitting: Obstetrics and Gynecology

## 2023-07-02 ENCOUNTER — Encounter (HOSPITAL_COMMUNITY)
Admission: RE | Admit: 2023-07-02 | Discharge: 2023-07-02 | Disposition: A | Discharge status: Home / Self Care | Payer: 59 | Source: Ambulatory Visit | Attending: Obstetrics and Gynecology

## 2023-07-02 VITALS — Ht 67.0 in | Wt 238.0 lb

## 2023-07-02 DIAGNOSIS — Z3A40 40 weeks gestation of pregnancy: Secondary | ICD-10-CM

## 2023-07-02 LAB — TYPE AND SCREEN
ABO/RH(D): O POS
Antibody Screen: NEGATIVE

## 2023-07-02 LAB — CBC
HCT: 36 % (ref 36.0–46.0)
Hemoglobin: 11.5 g/dL — ABNORMAL LOW (ref 12.0–15.0)
MCH: 25.2 pg — ABNORMAL LOW (ref 26.0–34.0)
MCHC: 31.9 g/dL (ref 30.0–36.0)
MCV: 78.8 fL — ABNORMAL LOW (ref 80.0–100.0)
Platelets: 202 10*3/uL (ref 150–400)
RBC: 4.57 MIL/uL (ref 3.87–5.11)
RDW: 14.6 % (ref 11.5–15.5)
WBC: 6.4 10*3/uL (ref 4.0–10.5)
nRBC: 0 % (ref 0.0–0.2)

## 2023-07-02 LAB — RPR: RPR Ser Ql: NONREACTIVE

## 2023-07-03 ENCOUNTER — Other Ambulatory Visit: Payer: Self-pay

## 2023-07-03 ENCOUNTER — Inpatient Hospital Stay (HOSPITAL_COMMUNITY): Payer: Medicaid Other | Admitting: Anesthesiology

## 2023-07-03 ENCOUNTER — Encounter (HOSPITAL_COMMUNITY)
Admission: AD | Disposition: A | Discharge status: Home / Self Care | Payer: Self-pay | Source: Home / Self Care | Attending: Obstetrics and Gynecology

## 2023-07-03 ENCOUNTER — Encounter (HOSPITAL_COMMUNITY): Payer: Self-pay | Admitting: Obstetrics and Gynecology

## 2023-07-03 ENCOUNTER — Inpatient Hospital Stay (HOSPITAL_COMMUNITY)
Admission: AD | Admit: 2023-07-03 | Discharge: 2023-07-05 | DRG: 784 | Disposition: A | Discharge status: Home / Self Care | Payer: Medicaid Other | Attending: Obstetrics and Gynecology | Admitting: Obstetrics and Gynecology

## 2023-07-03 ENCOUNTER — Inpatient Hospital Stay (HOSPITAL_COMMUNITY): Admission: AD | Admit: 2023-07-03 | Payer: Medicaid Other | Source: Home / Self Care

## 2023-07-03 DIAGNOSIS — Z98891 History of uterine scar from previous surgery: Principal | ICD-10-CM

## 2023-07-03 DIAGNOSIS — Z302 Encounter for sterilization: Secondary | ICD-10-CM | POA: Diagnosis not present

## 2023-07-03 DIAGNOSIS — Z3A39 39 weeks gestation of pregnancy: Secondary | ICD-10-CM | POA: Diagnosis not present

## 2023-07-03 DIAGNOSIS — O9902 Anemia complicating childbirth: Secondary | ICD-10-CM | POA: Diagnosis present

## 2023-07-03 DIAGNOSIS — O1002 Pre-existing essential hypertension complicating childbirth: Secondary | ICD-10-CM | POA: Diagnosis not present

## 2023-07-03 DIAGNOSIS — O3413 Maternal care for benign tumor of corpus uteri, third trimester: Secondary | ICD-10-CM | POA: Diagnosis present

## 2023-07-03 DIAGNOSIS — O34211 Maternal care for low transverse scar from previous cesarean delivery: Secondary | ICD-10-CM | POA: Diagnosis not present

## 2023-07-03 DIAGNOSIS — O99824 Streptococcus B carrier state complicating childbirth: Secondary | ICD-10-CM | POA: Diagnosis present

## 2023-07-03 DIAGNOSIS — O99820 Streptococcus B carrier state complicating pregnancy: Secondary | ICD-10-CM | POA: Diagnosis not present

## 2023-07-03 DIAGNOSIS — Z148 Genetic carrier of other disease: Secondary | ICD-10-CM | POA: Diagnosis not present

## 2023-07-03 DIAGNOSIS — O09523 Supervision of elderly multigravida, third trimester: Secondary | ICD-10-CM | POA: Diagnosis not present

## 2023-07-03 DIAGNOSIS — D259 Leiomyoma of uterus, unspecified: Secondary | ICD-10-CM | POA: Diagnosis present

## 2023-07-03 DIAGNOSIS — O1092 Unspecified pre-existing hypertension complicating childbirth: Secondary | ICD-10-CM | POA: Diagnosis present

## 2023-07-03 HISTORY — PX: TUBAL LIGATION: SHX77

## 2023-07-03 LAB — POCT FERN TEST: POCT Fern Test: POSITIVE

## 2023-07-03 SURGERY — Surgical Case
Anesthesia: Epidural | Site: Abdomen

## 2023-07-03 MED ORDER — PRENATAL MULTIVITAMIN CH
1.0000 | ORAL_TABLET | Freq: Every day | ORAL | Status: DC
  Administered 2023-07-04 – 2023-07-05 (×2): 1 via ORAL
  Filled 2023-07-03 (×2): qty 1

## 2023-07-03 MED ORDER — ONDANSETRON HCL 4 MG/2ML IJ SOLN
INTRAMUSCULAR | Status: DC | PRN
  Administered 2023-07-03: 4 mg via INTRAVENOUS

## 2023-07-03 MED ORDER — SIMETHICONE 80 MG PO CHEW
80.0000 mg | CHEWABLE_TABLET | Freq: Three times a day (TID) | ORAL | Status: DC
  Administered 2023-07-03 – 2023-07-05 (×5): 80 mg via ORAL
  Filled 2023-07-03 (×5): qty 1

## 2023-07-03 MED ORDER — FENTANYL CITRATE (PF) 100 MCG/2ML IJ SOLN: 25.0000 ug | INTRAMUSCULAR | Status: DC | PRN

## 2023-07-03 MED ORDER — BUPIVACAINE IN DEXTROSE 0.75-8.25 % IT SOLN
INTRATHECAL | Status: DC | PRN
  Administered 2023-07-03: 1.5 mL via INTRATHECAL

## 2023-07-03 MED ORDER — SOD CITRATE-CITRIC ACID 500-334 MG/5ML PO SOLN: 30.0000 mL | ORAL | Status: DC

## 2023-07-03 MED ORDER — KETOROLAC TROMETHAMINE 30 MG/ML IJ SOLN: 30.0000 mg | Freq: Four times a day (QID) | INTRAMUSCULAR | Status: DC | PRN

## 2023-07-03 MED ORDER — CEFAZOLIN SODIUM-DEXTROSE 2-4 GM/100ML-% IV SOLN
2.0000 g | INTRAVENOUS | Status: AC
  Administered 2023-07-03: 2 g via INTRAVENOUS
  Filled 2023-07-03: qty 100

## 2023-07-03 MED ORDER — HYDROMORPHONE HCL 2 MG PO TABS
2.0000 mg | ORAL_TABLET | ORAL | Status: DC | PRN
  Administered 2023-07-04 – 2023-07-05 (×2): 2 mg via ORAL
  Filled 2023-07-03 (×2): qty 1

## 2023-07-03 MED ORDER — FENTANYL CITRATE (PF) 100 MCG/2ML IJ SOLN
INTRAMUSCULAR | Status: AC
  Filled 2023-07-03: qty 2

## 2023-07-03 MED ORDER — BISACODYL 10 MG RE SUPP: 10.0000 mg | Freq: Every day | RECTAL | Status: DC | PRN

## 2023-07-03 MED ORDER — NALOXONE HCL 4 MG/10ML IJ SOLN: 1.0000 ug/kg/h | INTRAVENOUS | Status: DC | PRN

## 2023-07-03 MED ORDER — TETANUS-DIPHTH-ACELL PERTUSSIS 5-2.5-18.5 LF-MCG/0.5 IM SUSY: 0.5000 mL | PREFILLED_SYRINGE | Freq: Once | INTRAMUSCULAR | Status: DC

## 2023-07-03 MED ORDER — ACETAMINOPHEN 500 MG PO TABS
1000.0000 mg | ORAL_TABLET | Freq: Four times a day (QID) | ORAL | Status: DC
  Administered 2023-07-03 – 2023-07-05 (×8): 1000 mg via ORAL
  Filled 2023-07-03 (×8): qty 2

## 2023-07-03 MED ORDER — MENTHOL 3 MG MT LOZG: 1.0000 | LOZENGE | OROMUCOSAL | Status: DC | PRN

## 2023-07-03 MED ORDER — ACETAMINOPHEN 160 MG/5ML PO SOLN: 1000.0000 mg | Freq: Once | ORAL | Status: DC

## 2023-07-03 MED ORDER — DEXMEDETOMIDINE HCL IN NACL 80 MCG/20ML IV SOLN
INTRAVENOUS | Status: DC | PRN
  Administered 2023-07-03: 8 ug via INTRAVENOUS

## 2023-07-03 MED ORDER — MORPHINE SULFATE (PF) 0.5 MG/ML IJ SOLN
INTRAMUSCULAR | Status: DC | PRN
  Administered 2023-07-03: .15 mg via INTRATHECAL

## 2023-07-03 MED ORDER — LACTATED RINGERS IV SOLN: INTRAVENOUS | Status: DC | PRN

## 2023-07-03 MED ORDER — SODIUM CHLORIDE 0.9 % IV SOLN
500.0000 mg | INTRAVENOUS | Status: AC
  Administered 2023-07-03: 250 mg via INTRAVENOUS

## 2023-07-03 MED ORDER — ACETAMINOPHEN 500 MG PO TABS: 1000.0000 mg | ORAL_TABLET | Freq: Once | ORAL | Status: DC

## 2023-07-03 MED ORDER — DIBUCAINE (PERIANAL) 1 % EX OINT: 1.0000 | TOPICAL_OINTMENT | CUTANEOUS | Status: DC | PRN

## 2023-07-03 MED ORDER — FLEET ENEMA RE ENEM: 1.0000 | ENEMA | Freq: Every day | RECTAL | Status: DC | PRN

## 2023-07-03 MED ORDER — SENNOSIDES-DOCUSATE SODIUM 8.6-50 MG PO TABS
2.0000 | ORAL_TABLET | ORAL | Status: DC
  Administered 2023-07-04 – 2023-07-05 (×2): 2 via ORAL
  Filled 2023-07-03 (×2): qty 2

## 2023-07-03 MED ORDER — NALOXONE HCL 0.4 MG/ML IJ SOLN: 0.4000 mg | INTRAMUSCULAR | Status: DC | PRN

## 2023-07-03 MED ORDER — KETOROLAC TROMETHAMINE 30 MG/ML IJ SOLN
30.0000 mg | Freq: Four times a day (QID) | INTRAMUSCULAR | Status: AC
  Administered 2023-07-03 – 2023-07-04 (×3): 30 mg via INTRAVENOUS
  Filled 2023-07-03 (×3): qty 1

## 2023-07-03 MED ORDER — SCOPOLAMINE 1 MG/3DAYS TD PT72
MEDICATED_PATCH | TRANSDERMAL | Status: AC
  Filled 2023-07-03: qty 1

## 2023-07-03 MED ORDER — DROPERIDOL 2.5 MG/ML IJ SOLN: 0.6250 mg | Freq: Once | INTRAMUSCULAR | Status: DC | PRN

## 2023-07-03 MED ORDER — COCONUT OIL OIL: 1.0000 | TOPICAL_OIL | Status: DC | PRN

## 2023-07-03 MED ORDER — OXYTOCIN-SODIUM CHLORIDE 30-0.9 UT/500ML-% IV SOLN
INTRAVENOUS | Status: AC
  Filled 2023-07-03: qty 500

## 2023-07-03 MED ORDER — SIMETHICONE 80 MG PO CHEW
80.0000 mg | CHEWABLE_TABLET | ORAL | Status: DC | PRN
  Administered 2023-07-04: 80 mg via ORAL
  Filled 2023-07-03: qty 1

## 2023-07-03 MED ORDER — PHENYLEPHRINE HCL-NACL 20-0.9 MG/250ML-% IV SOLN
INTRAVENOUS | Status: DC | PRN
  Administered 2023-07-03: 60 ug/min via INTRAVENOUS

## 2023-07-03 MED ORDER — FAMOTIDINE IN NACL 20-0.9 MG/50ML-% IV SOLN
20.0000 mg | Freq: Once | INTRAVENOUS | Status: AC
  Administered 2023-07-03: 20 mg via INTRAVENOUS
  Filled 2023-07-03: qty 50

## 2023-07-03 MED ORDER — ACETAMINOPHEN 500 MG PO TABS: 1000.0000 mg | ORAL_TABLET | Freq: Four times a day (QID) | ORAL | Status: DC

## 2023-07-03 MED ORDER — SOD CITRATE-CITRIC ACID 500-334 MG/5ML PO SOLN
30.0000 mL | Freq: Once | ORAL | Status: AC
  Administered 2023-07-03: 30 mL via ORAL
  Filled 2023-07-03: qty 30

## 2023-07-03 MED ORDER — LACTATED RINGERS IV SOLN: INTRAVENOUS | Status: DC

## 2023-07-03 MED ORDER — DIPHENHYDRAMINE HCL 25 MG PO CAPS: 25.0000 mg | ORAL_CAPSULE | ORAL | Status: DC | PRN

## 2023-07-03 MED ORDER — NIFEDIPINE ER OSMOTIC RELEASE 30 MG PO TB24
30.0000 mg | ORAL_TABLET | Freq: Every day | ORAL | Status: DC
  Administered 2023-07-03 – 2023-07-05 (×3): 30 mg via ORAL
  Filled 2023-07-03 (×3): qty 1

## 2023-07-03 MED ORDER — MORPHINE SULFATE (PF) 0.5 MG/ML IJ SOLN
INTRAMUSCULAR | Status: AC
  Filled 2023-07-03: qty 10

## 2023-07-03 MED ORDER — OXYTOCIN-SODIUM CHLORIDE 30-0.9 UT/500ML-% IV SOLN
INTRAVENOUS | Status: DC | PRN
  Administered 2023-07-03: 300 mL via INTRAVENOUS

## 2023-07-03 MED ORDER — ONDANSETRON HCL 4 MG/2ML IJ SOLN: 4.0000 mg | Freq: Three times a day (TID) | INTRAMUSCULAR | Status: DC | PRN

## 2023-07-03 MED ORDER — OXYTOCIN-SODIUM CHLORIDE 30-0.9 UT/500ML-% IV SOLN: 2.5000 [IU]/h | INTRAVENOUS | Status: AC

## 2023-07-03 MED ORDER — KETOROLAC TROMETHAMINE 30 MG/ML IJ SOLN
INTRAMUSCULAR | Status: AC
  Filled 2023-07-03: qty 1

## 2023-07-03 MED ORDER — SODIUM CHLORIDE 0.9% FLUSH: 3.0000 mL | INTRAVENOUS | Status: DC | PRN

## 2023-07-03 MED ORDER — WITCH HAZEL-GLYCERIN EX PADS: 1.0000 | MEDICATED_PAD | CUTANEOUS | Status: DC | PRN

## 2023-07-03 MED ORDER — IBUPROFEN 600 MG PO TABS
600.0000 mg | ORAL_TABLET | Freq: Four times a day (QID) | ORAL | Status: DC
  Administered 2023-07-04 – 2023-07-05 (×5): 600 mg via ORAL
  Filled 2023-07-03 (×5): qty 1

## 2023-07-03 MED ORDER — KETOROLAC TROMETHAMINE 30 MG/ML IJ SOLN
30.0000 mg | Freq: Once | INTRAMUSCULAR | Status: AC
  Administered 2023-07-03: 30 mg via INTRAVENOUS

## 2023-07-03 MED ORDER — ACETAMINOPHEN 10 MG/ML IV SOLN
INTRAVENOUS | Status: DC | PRN
  Administered 2023-07-03: 1000 mg via INTRAVENOUS

## 2023-07-03 MED ORDER — FENTANYL CITRATE (PF) 100 MCG/2ML IJ SOLN
INTRAMUSCULAR | Status: DC | PRN
  Administered 2023-07-03: 15 ug via INTRATHECAL

## 2023-07-03 MED ORDER — DEXMEDETOMIDINE HCL IN NACL 80 MCG/20ML IV SOLN
INTRAVENOUS | Status: AC
  Filled 2023-07-03: qty 20

## 2023-07-03 MED ORDER — SCOPOLAMINE 1 MG/3DAYS TD PT72
1.0000 | MEDICATED_PATCH | TRANSDERMAL | Status: DC
  Administered 2023-07-03: 1.5 mg via TRANSDERMAL

## 2023-07-03 MED ORDER — HYDROMORPHONE HCL 1 MG/ML IJ SOLN: 0.2000 mg | INTRAMUSCULAR | Status: DC | PRN

## 2023-07-03 MED ORDER — DIPHENHYDRAMINE HCL 50 MG/ML IJ SOLN: 12.5000 mg | INTRAMUSCULAR | Status: DC | PRN

## 2023-07-03 MED ORDER — DEXAMETHASONE SODIUM PHOSPHATE 10 MG/ML IJ SOLN
INTRAMUSCULAR | Status: DC | PRN
  Administered 2023-07-03: 10 mg via INTRAVENOUS

## 2023-07-03 MED ORDER — DIPHENHYDRAMINE HCL 25 MG PO CAPS: 25.0000 mg | ORAL_CAPSULE | Freq: Four times a day (QID) | ORAL | Status: DC | PRN

## 2023-07-03 SURGICAL SUPPLY — 33 items
APL PRP STRL LF DISP 70% ISPRP (MISCELLANEOUS) ×4
CHLORAPREP W/TINT 26 (MISCELLANEOUS) ×4 IMPLANT
CLAMP UMBILICAL CORD (MISCELLANEOUS) ×2 IMPLANT
CLIP FILSHIE TUBAL LIGA STRL (Clip) IMPLANT
CLOTH BEACON ORANGE TIMEOUT ST (SAFETY) ×2 IMPLANT
DRSG OPSITE POSTOP 4X10 (GAUZE/BANDAGES/DRESSINGS) ×2 IMPLANT
ELECT REM PT RETURN 9FT ADLT (ELECTROSURGICAL) ×2
ELECTRODE REM PT RTRN 9FT ADLT (ELECTROSURGICAL) ×2 IMPLANT
EXTRACTOR VACUUM KIWI (MISCELLANEOUS) IMPLANT
EXTRACTOR VACUUM M CUP 4 TUBE (SUCTIONS) IMPLANT
GAUZE SPONGE 4X4 12PLY STRL LF (GAUZE/BANDAGES/DRESSINGS) IMPLANT
GLOVE BIOGEL M 6.5 STRL (GLOVE) ×2 IMPLANT
GLOVE BIOGEL PI IND STRL 6.5 (GLOVE) ×2 IMPLANT
GLOVE BIOGEL PI IND STRL 7.0 (GLOVE) ×4 IMPLANT
GOWN STRL REUS W/TWL LRG LVL3 (GOWN DISPOSABLE) ×4 IMPLANT
KIT ABG SYR 3ML LUER SLIP (SYRINGE) IMPLANT
NDL HYPO 25X5/8 SAFETYGLIDE (NEEDLE) IMPLANT
NEEDLE HYPO 25X5/8 SAFETYGLIDE (NEEDLE) IMPLANT
NS IRRIG 1000ML POUR BTL (IV SOLUTION) ×2 IMPLANT
PACK C SECTION WH (CUSTOM PROCEDURE TRAY) ×2 IMPLANT
PAD ABD 7.5X8 STRL (GAUZE/BANDAGES/DRESSINGS) IMPLANT
PAD OB MATERNITY 4.3X12.25 (PERSONAL CARE ITEMS) ×2 IMPLANT
RTRCTR C-SECT PINK 25CM LRG (MISCELLANEOUS) ×2 IMPLANT
SUT MON AB 2-0 CT1 27 (SUTURE) ×2 IMPLANT
SUT PDS AB 0 CTX 60 (SUTURE) IMPLANT
SUT PLAIN 0 NONE (SUTURE) IMPLANT
SUT PLAIN 2 0 XLH (SUTURE) IMPLANT
SUT VIC AB 0 CTX 36 (SUTURE) ×8
SUT VIC AB 0 CTX36XBRD ANBCTRL (SUTURE) ×8 IMPLANT
SUT VIC AB 4-0 KS 27 (SUTURE) ×2 IMPLANT
TOWEL OR 17X24 6PK STRL BLUE (TOWEL DISPOSABLE) ×2 IMPLANT
TRAY FOLEY W/BAG SLVR 14FR LF (SET/KITS/TRAYS/PACK) ×2 IMPLANT
WATER STERILE IRR 1000ML POUR (IV SOLUTION) ×2 IMPLANT

## 2023-07-03 NOTE — Op Note (Signed)
CESAREAN SECTION Procedure Note  Patient: Destiny Jones is a 37 y.o. [id number] @ [redacted]w[redacted]d  Preoperative Diagnosis:  Intrauterine pregnancy at 39 weeks 6 days History of cesarean, desire for repeat Spontaneous rupture of membranes Desire for permanent sterilization  Postoperative Diagnosis: same, delivered  Procedure:  Repeat low transverse cesarean and bilateral tubal ligation     Surgeon: Charlett Nose , MD  Assistant: Sundra Aland, MD  An experienced assistant was required given the standard of surgical care given the complexity of the case.  This assistant was needed for exposure, dissection, suctioning, retraction, instrument exchange,  assisting with delivery with administration of fundal pressure, and for overall help during the procedure  Anesthesia: Spinal anesthesia   Findings: Uterus with two 3cm anterior type 5 fibroids (superior to hysterotopmy). Normal appearing fallopian tubes bilaterally and ovaries bilaterally.  Viable female infant in vertex presentation delivered at 0719 with weight 3360g (7 pounds 6.5ounces) Apgars 8 and 9.  Estimated Blood Loss:          Specimens: Placenta to L&D for disposal. Portions of bilateral fallopian tubes to pathology.          Complications:  None         Disposition: PACU - hemodynamically stable.         Condition: stable    Description of Procedure: The patient was taken to the operating room where spinal anesthesia was placed and found to be adequate.  The patient was placed in the dorsal supine position.  Fetal heart tones were confirmed. Thromboguards were applied and cycling. A foley catheter was inserted and draining. Ancef 2g and azithromycin 500mg  were given for infection prophylaxis. The patient was subsequently prepped and draped in the normal sterile fashion.    A low transverse skin incision was made with a scalpel and carried down to the level of the fascia with the Bovie.  The fascia was incised in the  midline with the scalpel and extended laterally with curved Mayo scissors.  Kocher clamps were applied to the inferior fascial edge and the fascia was dissected off the rectus muscle sharply using the Mayo scissors.  The Kocher clamps were transferred to the superior fascial edge and the underlying rectus muscle was dissected off with curved Mayo's scissors.  The rectus muscles then were separated in the midline.  The peritoneum was found free of adherent bowel and the peritoneal cavity was entered with Metzenbaum scissors.  The uterus was identified and the alexis retractor was placed intraperitoneal.  A bladder flap was then created sharply with Metzenbaum scissors and separated from the lower uterine segment digitally.   A low transverse hysterotomy was then made with a scalpel.  The infant was found in the vertex presentation and the infant head was brought to the hysterotomy. Due to difficulty delivering the head through the hysterotomy, the Kiwi vacuum was applied to the vertex and was delivered with 2 pulls (1 pop off).  The infant's body was delivered without difficulty. After 60 seconds of delayed cord clamping the cord was clamped and cut and the infant was handed off to the pediatricians.  The placenta was delivered with gentle traction on umbilical cord and manual massage of the uterine fundus.  The uterus was cleared of all clot and debris.  The hysterotomy was then closed with 0 monocryl in a running locked fashion,  followed by 0 Monocryl in an imbricating fashion.  The hysterotomy was found to be hemostatic.   Attention was turned  to the fallopian tubes. The right fallopian tube was grasped in the mid isthmic portion with Babcock clamps and a window was created in an avascular area of the mesosalpinx with the bovie. Two ties of 0 chromic were passed through the opening. One strand was used to ligate the proximal end of the tube and the other to ligate the distal end of the tube. A 2 cm segment  was excised with Metzenbaum scissors between the sutures. Hemostasis was appreciated. The same was repeated on the left side.   The peritoneum was closed with 3-0 vicryl in a running fashion. The fascia was closed with a 0 Vicryl suture in a continuous running fashion.  The subcutaneous tissue was irrigated and rendered hemostatic with cautery.  The subcutaneous layer was subsequently closed with 3-0 Vicryl in a continuous running fashion.  The skin was closed with 4-0 vicryl  in a running subcuticular fashion.  Sponge, lap and needle counts were correct. Steri strips and a Honeycomb dressing were placed on the incision.  Charlett Nose 07/03/23  8:14 AM

## 2023-07-03 NOTE — Anesthesia Procedure Notes (Addendum)
Spinal  Patient location during procedure: OR Start time: 07/03/2023 6:50 AM End time: 07/03/2023 6:53 AM Reason for block: surgical anesthesia Staffing Performed: anesthesiologist  Anesthesiologist: Kaylyn Layer, MD Performed by: Kaylyn Layer, MD Authorized by: Kaylyn Layer, MD   Preanesthetic Checklist Completed: patient identified, IV checked, risks and benefits discussed, monitors and equipment checked, pre-op evaluation and timeout performed Spinal Block Patient position: sitting Prep: DuraPrep and site prepped and draped Patient monitoring: heart rate, continuous pulse ox and blood pressure Approach: midline Location: L3-4 Injection technique: single-shot Needle Needle type: Pencan  Needle gauge: 24 G Needle length: 10 cm Assessment Sensory level: T4 Events: CSF return Additional Notes Risks, benefits, and alternative discussed. Patient gave consent to procedure. Prepped and draped in sitting position. Clear CSF obtained after one needle pass. Positive terminal aspiration. No pain or paraesthesias with injection. Patient tolerated procedure well. Vital signs stable. Amalia Greenhouse, MD

## 2023-07-03 NOTE — Progress Notes (Signed)
TC from RN.  BPs mild range since delivery.  Upon review, most BPs since admission mildly elevated.  H/o CHTN, not on meds during pregnancy.    Vitals:   07/03/23 0930 07/03/23 0945 07/03/23 1013 07/03/23 1120  BP: 136/86 (!) 149/90 (!) 152/95 (!) 141/81  Pulse: 68 66 70 72  Resp: 14 17  18   Temp:   97.8 F (36.6 C) 97.6 F (36.4 C)  TempSrc:   Oral Oral  SpO2: 100% 100%  99%  Weight:      Height:        Will start procardia Xl 30mg  daily

## 2023-07-03 NOTE — Lactation Note (Signed)
This note was copied from a baby's chart. Lactation Consultation Note  Patient Name: Destiny Jones [id number] Date: 07/03/2023 Age:39 hours Reason for consult: Initial assessment  P2,  Reviewed hand expression.  Glistening from R breast. Assisted with latching in football hold. Had mother compress breast to keep baby active.  Feed on demand with cues.  Goal 8-12+ times per day after first 24 hrs.  Place baby STS if not cueing.  Encouraged mother to frequently hand express before latching to increase her milk supply.   Maternal Data Has patient been taught Hand Expression?: Yes Does the patient have breastfeeding experience prior to this delivery?: Yes How long did the patient breastfeed?:  (Child now 8 years)  Feeding Mother's Current Feeding Choice: Breast Milk  LATCH Score Latch: Grasps breast easily, tongue down, lips flanged, rhythmical sucking.  Audible Swallowing: A few with stimulation  Type of Nipple: Everted at rest and after stimulation  Comfort (Breast/Nipple): Soft / non-tender  Hold (Positioning): Assistance needed to correctly position infant at breast and maintain latch.  LATCH Score: 8 Interventions Interventions: Breast feeding basics reviewed;Assisted with latch;Skin to skin;Hand express;Education;LC Services brochure  Consult Status Consult Status: Follow-up    Dahlia Byes [facility] 07/03/2023, 12:54 PM

## 2023-07-03 NOTE — Anesthesia Preprocedure Evaluation (Addendum)
Anesthesia Evaluation  Patient identified by MRN, date of birth, ID band Patient awake    Reviewed: Allergy & Precautions, NPO status , Patient's Chart, lab work & pertinent test results  History of Anesthesia Complications Negative for: history of anesthetic complications  Airway Mallampati: II  TM Distance: >3 FB Neck ROM: Full    Dental no notable dental hx.    Pulmonary neg pulmonary ROS   Pulmonary exam normal        Cardiovascular hypertension, Pt. on medications Normal cardiovascular exam     Neuro/Psych negative neurological ROS  negative psych ROS   GI/Hepatic Neg liver ROS,GERD  ,,  Endo/Other  negative endocrine ROS    Renal/GU negative Renal ROS  negative genitourinary   Musculoskeletal negative musculoskeletal ROS (+)    Abdominal   Peds  Hematology negative hematology ROS (+)   Anesthesia Other Findings Day of surgery medications reviewed with patient.  Reproductive/Obstetrics (+) Pregnancy (Hx of C/S/x1)                             Anesthesia Physical Anesthesia Plan  ASA: 2 and emergent  Anesthesia Plan: Spinal   Post-op Pain Management: Ofirmev IV (intra-op)*   Induction:   PONV Risk Score and Plan: 2 and Treatment may vary due to age or medical condition, Ondansetron and Dexamethasone  Airway Management Planned: Natural Airway  Additional Equipment:   Intra-op Plan:   Post-operative Plan:   Informed Consent: I have reviewed the patients History and Physical, chart, labs and discussed the procedure including the risks, benefits and alternatives for the proposed anesthesia with the patient or authorized representative who has indicated his/her understanding and acceptance.       Plan Discussed with: CRNA  Anesthesia Plan Comments:        Anesthesia Quick Evaluation

## 2023-07-03 NOTE — H&P (Signed)
Destiny Jones is a 37 y.o. female [phone] [redacted]w[redacted]d presenting for leakage of fluid at 0340 this AM. She denies VB, Contractions. Normal FM.   Pregnancy c/b: History of cesarean delivery: previous cesarean for 10lb baby AMA: LR NIPT Uterine fibroids: 3 fibroids measuring 2.0-3.5cm, post, ant and ant fundal Anemia: s/p IV iron H/o HTN: was on labetalol at start of pregnancy but discontinued due to low BPs  OB History     Gravida  4   Para  1   Term  1   Preterm      AB  2   Living  1      SAB  1   IAB  1   Ectopic      Multiple  0   Live Births  1          Past Medical History:  Diagnosis Date   Anemia    Blount's disease    GERD (gastroesophageal reflux disease)    Hx of chlamydia infection    Hx of gonorrhea    Hx of varicella    Hypertension    Miscarriage    Thalassemia trait    Past Surgical History:  Procedure Laterality Date   CESAREAN SECTION N/A 06/03/2015   Procedure: CESAREAN SECTION;  Surgeon: Osborn Coho, MD;  Location: [facility];  Service: Obstetrics;  Laterality: N/A;   LEG SURGERY     for Blount's?disease (bow leg)   TONSILLECTOMY     Family History: family history is not on file. Social History:  reports that she has never smoked. She has never used smokeless tobacco. She reports current alcohol use. She reports that she does not use drugs.     Maternal Diabetes: No Genetic Screening: Normal Maternal Ultrasounds/Referrals: Normal Fetal Ultrasounds or other Referrals:  None Maternal Substance Abuse:  No Significant Maternal Medications:  None Significant Maternal Lab Results:  Group B Strep positive Other Comments:  None  Review of Systems Per HPI Exam Physical Exam    Blood pressure (!) 140/83, pulse 86, temperature 98.4 F (36.9 C), temperature source Oral, resp. rate 16, height 5\' 7"  (1.702 m), weight 108.3 kg, last menstrual period 09/27/2022, unknown if currently breastfeeding. Gen: NAD, resting comfortably CVS: normal  pulses Lungs: Nonlabored respirations Abd: Gravid abdomen Ext: no calf edema or tenderness Fetal testing: 135bpm, mod variability, + accels, no decels Toco: no contractions Prenatal labs: ABO, Rh:  --/--/O POS (08/23 1021) Antibody: NEG (08/23 1021) Rubella: Immune (01/12 0000) RPR: NON REACTIVE (08/23 1012)  HBsAg: Negative (01/12 0000)  HIV: Non-reactive (01/12 0000)  GBS: Positive/-- (01/12 0000)   Assessment/Plan: 37Y [id number] @ [redacted]w[redacted]d, SROM, plan for repeat cesarean and bilateral tubal ligation - Reviewed consent. Discussed risk of infection, bleeding, hysterectomy, blood transfusion, damage to surrounding organs, injury to infant, failure of sterilization, risk of regret. All questions answered. Discussed salpingectomy vs. Tubal ligation and she prefers tubal ligation. Consent signed. - Ancef 2g and Azithro 500mg  ordered  Charlett Nose 07/03/2023, 6:36 AM

## 2023-07-03 NOTE — MAU Note (Addendum)
Pt says at 0340- SROM - clear - she was asleep-  Went to b-room- fluid coming out and has continued  Feels pressure- but not UC's  4/10 Is sch for repeat C/S on Monday 8-26  Had labs drawn yesterday

## 2023-07-03 NOTE — Transfer of Care (Signed)
Immediate Anesthesia Transfer of Care Note  Patient: Destiny Jones  Procedure(s) Performed: CESAREAN SECTION (Abdomen) BILATERAL TUBAL LIGATION (Bilateral)  Patient Location: PACU  Anesthesia Type:Spinal  Level of Consciousness: awake  Airway & Oxygen Therapy: Patient Spontanous Breathing  Post-op Assessment: Report given to RN  Post vital signs: Reviewed and stable  Last Vitals:  Vitals Value Taken Time  BP    Temp    Pulse    Resp    SpO2      Last Pain:  Vitals:   07/03/23 0517  TempSrc: Oral  PainSc:          Complications: No notable events documented.

## 2023-07-04 LAB — CBC
HCT: 34 % — ABNORMAL LOW (ref 36.0–46.0)
Hemoglobin: 11 g/dL — ABNORMAL LOW (ref 12.0–15.0)
MCH: 25.2 pg — ABNORMAL LOW (ref 26.0–34.0)
MCHC: 32.4 g/dL (ref 30.0–36.0)
MCV: 78 fL — ABNORMAL LOW (ref 80.0–100.0)
Platelets: 205 10*3/uL (ref 150–400)
RBC: 4.36 MIL/uL (ref 3.87–5.11)
RDW: 14.5 % (ref 11.5–15.5)
WBC: 15.4 10*3/uL — ABNORMAL HIGH (ref 4.0–10.5)
nRBC: 0 % (ref 0.0–0.2)

## 2023-07-04 NOTE — Anesthesia Postprocedure Evaluation (Signed)
Anesthesia Post Note  Patient: Destiny Jones  Procedure(s) Performed: CESAREAN SECTION (Abdomen) BILATERAL TUBAL LIGATION (Bilateral)     Patient location during evaluation: Mother Baby Anesthesia Type: Epidural Level of consciousness: oriented and awake and alert Pain management: pain level controlled Vital Signs Assessment: post-procedure vital signs reviewed and stable Respiratory status: spontaneous breathing and respiratory function stable Cardiovascular status: blood pressure returned to baseline and stable Postop Assessment: no headache, no backache, no apparent nausea or vomiting and able to ambulate Anesthetic complications: no   No notable events documented.  Last Vitals:  Vitals:   07/03/23 2154 07/04/23 0242  BP: 123/67 132/77  Pulse: 68   Resp: 20 20  Temp: 36.6 C 36.7 C  SpO2: 98% 98%    Last Pain:  Vitals:   07/04/23 0242  TempSrc: Oral  PainSc:    Pain Goal:                   Trevor Iha

## 2023-07-04 NOTE — Progress Notes (Signed)
Patient is doing well.  She is tolerating PO, ambulating, voiding.  Pain is controlled.  Lochia is appropriate.  BPs were trending up yesterday, so she was started on procardia Xl 30mg  daily.  BPs now much improved  Vitals:   07/03/23 1330 07/03/23 1750 07/03/23 2154 07/04/23 0242  BP: 134/85  123/67 132/77  Pulse: 65  68   Resp: 18 18 20 20   Temp: 97.8 F (36.6 C) 98.2 F (36.8 C) 97.9 F (36.6 C) 98.1 F (36.7 C)  TempSrc: Oral Oral  Oral  SpO2: 99% 100% 98% 98%  Weight:      Height:        NAD Abdomen:  soft, appropriate tenderness, incisions intact and without erythema or drainage ext:    Symmetric, no edema bilaterally  Lab Results  Component Value Date   WBC 15.4 (H) 07/04/2023   HGB 11.0 (L) 07/04/2023   HCT 34.0 (L) 07/04/2023   MCV 78.0 (L) 07/04/2023   PLT 205 07/04/2023    --/--/O POS (08/23 1021)  A/P    37 y.o. [id number] POD #1 s/p RCS and BTL Routine post op and postpartum care.   CHTN:  no meds during pregnancy.  Started on procardia Xl 30mg  daily yesterday with good improvement.  If BPs remain stable, anticipate discharge tomorrow with f/u in office 5 days after discharge Desires circumcision.   Discussed r/b/a of the procedure.  Reviewed that circumcision is an elective surgical procedure and not considered medically necessary.  Reviewed the risks of the procedure including the risk of infection, bleeding, damage to surrounding structures, including scrotum, shaft, urethra and head of penis, and an undesired cosmetic effect requiring additional procedures for revision.  Consent signed.

## 2023-07-05 ENCOUNTER — Inpatient Hospital Stay (HOSPITAL_COMMUNITY)
Admission: RE | Admit: 2023-07-05 | Payer: Medicaid Other | Source: Ambulatory Visit | Admitting: Obstetrics and Gynecology

## 2023-07-05 MED ORDER — NIFEDIPINE ER 30 MG PO TB24: 30.0000 mg | ORAL_TABLET | Freq: Every day | ORAL | 1 refills | Status: AC

## 2023-07-05 MED ORDER — HYDROMORPHONE HCL 2 MG PO TABS: 2.0000 mg | ORAL_TABLET | ORAL | 0 refills | Status: AC | PRN

## 2023-07-05 MED ORDER — IBUPROFEN 600 MG PO TABS: 600.0000 mg | ORAL_TABLET | Freq: Four times a day (QID) | ORAL | 1 refills | Status: AC | PRN

## 2023-07-05 NOTE — Discharge Instructions (Addendum)
Call office with any concerns [phone]

## 2023-07-05 NOTE — Progress Notes (Signed)
Subjective: Postpartum Day 2: Cesarean Delivery Patient reports tolerating PO, + flatus, + BM, and no problems voiding. Pain well controlled. Lochia mild. She denies CP, SOB, HA. She is bonding well with baby. Feels ready for discharge home today.   Objective: Vital signs in last 24 hours: Temp:  [97.7 F (36.5 C)-98.2 F (36.8 C)] 98.2 F (36.8 C) (08/26 0513) Pulse Rate:  [64-84] 75 (08/26 0513) Resp:  [18-20] 18 (08/26 0513) BP: (126-131)/(63-72) 127/63 (08/26 0513) SpO2:  [99 %-100 %] 99 % (08/26 0513)  Physical Exam:  General: alert, cooperative, and no distress Lochia: appropriate Uterine Fundus: firm Incision: no significant drainage DVT Evaluation: No evidence of DVT seen on physical exam. No significant calf/ankle edema.  Recent Labs    07/04/23 0313  HGB 11.0*  HCT 34.0*    Assessment/Plan: Status post Cesarean section. Doing well postoperatively.  Discharge home with standard precautions and return to clinic : 1 week for BP and incision check and 6 weeks for postpartum visit   Destiny Jones W Destiny Vanderhoef, DO 07/05/2023, 10:38 AM

## 2023-07-05 NOTE — Lactation Note (Signed)
This note was copied from a baby's chart. Lactation Consultation Note Mom awake holding baby STS and covered up. Baby sleeping soundly. Mom stated she had given formula all ready because mom is afraid he won get enough from the breast. Mom stated that she would like someone to work w/her on latching because it pinches a little bit. Suggest chin tug and lip flange. Suggested mom call when she is ready to latch.  Patient Name: Destiny Jones [id number] Date: 07/05/2023 Age:69 hours Reason for consult: Follow-up assessment;Term;1st time breastfeeding   Maternal Data    Feeding Nipple Type: Slow - flow  LATCH Score                    Lactation Tools Discussed/Used    Interventions    Discharge    Consult Status Consult Status: Follow-up    Logen Heintzelman, Diamond Nickel 07/05/2023, 3:27 AM

## 2023-07-05 NOTE — Discharge Summary (Addendum)
Postpartum Discharge Summary  Date of Service updated      Patient Name: Destiny Jones DOB: [birth year] MRN: [id number]  Date of admission: 07/03/2023 Delivery date:07/03/2023 Delivering provider: Derl Barrow E Date of discharge: 07/05/2023  Admitting diagnosis: History of cesarean delivery [Z98.891] Intrauterine pregnancy: [redacted]w[redacted]d     Secondary diagnosis:  Principal Problem:   History of cesarean delivery  Additional problems: chtn    Discharge diagnosis: Term Pregnancy Delivered and CHTN                                              Post partum procedures: n/a Augmentation: N/A Complications: None  Hospital course: Sceduled C/S   37 y.o. yo [id number] at [redacted]w[redacted]d was admitted to the hospital 07/03/2023 for scheduled cesarean section with the following indication:Elective Repeat.Delivery details are as follows:  Membrane Rupture Time/Date: 3:40 AM,07/03/2023  Delivery Method:C-Section, Low Transverse Operative Delivery:N/A Details of operation can be found in separate operative note.  Patient had a postpartum course complicated by n/a.  She is ambulating, tolerating a regular diet, passing flatus, and urinating well. Patient is discharged home in stable condition on  07/05/23        Newborn Data: Birth date:07/03/2023 Birth time:7:19 AM Gender:Female Living status:Living Apgars:8 ,9  Weight:3360 g    Magnesium Sulfate received: No BMZ received: No  Physical exam  Vitals:   07/04/23 0242 07/04/23 1349 07/04/23 2054 07/05/23 0513  BP: 132/77 131/72 126/68 127/63  Pulse:  64 84 75  Resp: 20  20 18   Temp: 98.1 F (36.7 C) 97.8 F (36.6 C) 97.7 F (36.5 C) 98.2 F (36.8 C)  TempSrc: Oral Oral Oral Oral  SpO2: 98% 100% 99% 99%  Weight:      Height:       General: alert, cooperative, and no distress Lochia: appropriate Uterine Fundus: firm Incision: No significant erythema, Dressing is clean, dry, and intact DVT Evaluation: No evidence of DVT seen on physical  exam. Negative Homan's sign. Labs: Lab Results  Component Value Date   WBC 15.4 (H) 07/04/2023   HGB 11.0 (L) 07/04/2023   HCT 34.0 (L) 07/04/2023   MCV 78.0 (L) 07/04/2023   PLT 205 07/04/2023      Latest Ref Rng & Units 07/29/2015    7:45 PM  CMP  Glucose 65 - 99 mg/dL 147   BUN 6 - 20 mg/dL <5   Creatinine 8.29 - 1.00 mg/dL 5.62   Sodium 130 - 865 mmol/L 138   Potassium 3.5 - 5.1 mmol/L 3.4   Chloride 101 - 111 mmol/L 107   CO2 22 - 32 mmol/L 25   Calcium 8.9 - 10.3 mg/dL 9.3   Total Protein 6.5 - 8.1 g/dL 6.9   Total Bilirubin 0.3 - 1.2 mg/dL 0.4   Alkaline Phos 38 - 126 U/L 53   AST 15 - 41 U/L 19   ALT 14 - 54 U/L 12    Edinburgh Score:    07/04/2023   11:46 AM  Edinburgh Postnatal Depression Scale Screening Tool  I have been able to laugh and see the funny side of things. 0  I have looked forward with enjoyment to things. 0  I have blamed myself unnecessarily when things went wrong. 1  I have been anxious or worried for no good reason. 2  I have felt scared or panicky  for no good reason. 1  Things have been getting on top of me. 1  I have been so unhappy that I have had difficulty sleeping. 1  I have felt sad or miserable. 0  I have been so unhappy that I have been crying. 1  The thought of harming myself has occurred to me. 0  Edinburgh Postnatal Depression Scale Total 7      After visit meds:  Allergies as of 07/05/2023       Reactions   Oxycodone-acetaminophen Itching   Oxycodone Itching   Shellfish-derived Products Hives        Medication List     STOP taking these medications    labetalol 200 MG tablet Commonly known as: NORMODYNE       TAKE these medications    EPINEPHrine 0.3 mg/0.3 mL Soaj injection Commonly known as: EPI-PEN Inject 0.3 mg into the muscle as needed for anaphylaxis.   HYDROmorphone 2 MG tablet Commonly known as: DILAUDID Take 1 tablet (2 mg total) by mouth every 4 (four) hours as needed for up to 7 days for  severe pain or moderate pain.   hydrOXYzine 25 MG tablet Commonly known as: ATARAX Take 1 tablet by mouth 3 (three) times daily as needed for anxiety.   ibuprofen 600 MG tablet Commonly known as: ADVIL Take 1 tablet (600 mg total) by mouth every 6 (six) hours as needed for moderate pain or cramping. Do not use after 28 weeks of pregnancy What changed: reasons to take this   multivitamin-prenatal 27-0.8 MG Tabs tablet Take 1 tablet by mouth daily at 12 noon.   NIFEdipine 30 MG 24 hr tablet Commonly known as: ADALAT CC Take 1 tablet (30 mg total) by mouth daily. Start taking on: July 06, 2023   ondansetron 4 MG disintegrating tablet Commonly known as: ZOFRAN-ODT Take 4 mg by mouth every 8 (eight) hours as needed for nausea or vomiting.         Discharge home in stable condition Infant Feeding: Breast Infant Disposition:home with mother Discharge instruction: per After Visit Summary and Postpartum booklet. Activity: Advance as tolerated. Pelvic rest for 6 weeks.  Diet: low salt diet Anticipated Birth Control: BTL done PP Postpartum Appointment:6 weeks Additional Postpartum F/U: Postpartum Depression checkup, Incision check 1 week, and BP check 1 week Future Appointments:No future appointments. Follow up Visit:  Follow-up Information     Ob/Gyn, Nestor Ramp. Schedule an appointment as soon as possible for a visit today.   Why: 1 week for BP and incision check and 6 weeks for postpartum visit Contact information: [address] [place] [address] [phone]                     07/05/2023 Cathrine Muster, DO

## 2023-07-05 NOTE — Lactation Note (Signed)
This note was copied from a baby's chart. Lactation Consultation Note  Patient Name: Destiny Jones Date: 07/05/2023 Age:39 hours Reason for consult: Follow-up assessment;Term;1st time breastfeeding;Infant weight loss (7 % weight loss,) Dyad and dad ready for D/C . Per mom experiencing some nipple soreness. LC provided shells while awake and hand pump.  LC checked the flange size and the #24 F was good fit and per mom comfortable.  Baby has been beast and formula. LC discussed ways to enhance the flow of milk prior to latch and recommended adding some post pumping to help the  Milk come in quicker.  LC reviewed BF D/C teaching and the [facility] resources.    Maternal Data    Feeding Mother's Current Feeding Choice: Breast Milk and Formula  LATCH Score - baby had been recently fed , asleep and unable to check the latch.     Lactation Tools Discussed/Used Tools: Shells;Pump;Flanges Flange Size: 24 Breast pump type: Manual Pump Education: Milk Storage;Setup, frequency, and cleaning  Interventions Interventions: Breast feeding basics reviewed;Shells;Hand pump;Hand express;Reverse pressure;Education;LC Services brochure  Discharge Discharge Education: Engorgement and breast care;Warning signs for feeding baby Pump: Personal;DEBP;Manual  Consult Status Consult Status: Complete    [name] 07/05/2023, 4:50 PM

## 2023-07-06 ENCOUNTER — Encounter (HOSPITAL_COMMUNITY): Payer: Self-pay | Admitting: Obstetrics and Gynecology

## 2023-07-07 LAB — SURGICAL PATHOLOGY

## 2023-08-05 ENCOUNTER — Telehealth (HOSPITAL_COMMUNITY): Payer: Self-pay | Admitting: *Deleted

## 2023-08-05 NOTE — Telephone Encounter (Signed)
08/05/2023  Name: RENNEE LIENHART MRN: [id number] DOB: 12-02-1985  Reason for Call:  Transition of Care Hospital Discharge Call  Contact Status: Patient Contact Status: Message  Language assistant needed:          Follow-Up Questions:    Inocente Salles Postnatal Depression Scale:  In the Past 7 Days:    PHQ2-9 Depression Scale:     Discharge Follow-up:    Post-discharge interventions: NA  Salena Saner, RN 08/05/2023 09:34

## 2023-12-24 ENCOUNTER — Ambulatory Visit
Admission: EM | Admit: 2023-12-24 | Discharge: 2023-12-24 | Disposition: A | Discharge status: Home / Self Care | Payer: Medicaid Other | Attending: Family Medicine | Admitting: Family Medicine

## 2023-12-24 DIAGNOSIS — I10 Essential (primary) hypertension: Secondary | ICD-10-CM | POA: Diagnosis not present

## 2023-12-24 DIAGNOSIS — B349 Viral infection, unspecified: Secondary | ICD-10-CM | POA: Diagnosis not present

## 2023-12-24 LAB — POC COVID19/FLU A&B COMBO
Covid Antigen, POC: NEGATIVE
Influenza A Antigen, POC: NEGATIVE
Influenza B Antigen, POC: NEGATIVE

## 2023-12-24 MED ORDER — PROMETHAZINE-DM 6.25-15 MG/5ML PO SYRP: 2.5000 mL | ORAL_SOLUTION | Freq: Three times a day (TID) | ORAL | 0 refills | Status: AC | PRN

## 2023-12-24 MED ORDER — CETIRIZINE HCL 10 MG PO TABS: 10.0000 mg | ORAL_TABLET | Freq: Every day | ORAL | 0 refills | Status: AC

## 2023-12-24 MED ORDER — ACETAMINOPHEN 325 MG PO TABS: 650.0000 mg | ORAL_TABLET | Freq: Four times a day (QID) | ORAL | 0 refills | Status: AC | PRN

## 2023-12-24 NOTE — ED Provider Notes (Signed)
[facility]  Note:  This document was prepared using Conservation officer, historic buildings and may include unintentional dictation errors.  MRN: [id number] DOB: Jan 29, 1986  Subjective:   Destiny Jones is a 38 y.o. female presenting for 1 day history of coughing, chest congestion, chest tightness, malaise and fatigue.  Patient is breast-feeding.  No smoking of any kind including cigarettes, cigars, vaping, marijuana use.  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  No current facility-administered medications for this encounter.  Current Outpatient Medications:    EPINEPHrine 0.3 mg/0.3 mL IJ SOAJ injection, Inject 0.3 mg into the muscle as needed for anaphylaxis., Disp: , Rfl:    hydrOXYzine (ATARAX/VISTARIL) 25 MG tablet, Take 1 tablet by mouth 3 (three) times daily as needed for anxiety., Disp: , Rfl:    ibuprofen (ADVIL) 600 MG tablet, Take 1 tablet (600 mg total) by mouth every 6 (six) hours as needed for moderate pain or cramping. Do not use after 28 weeks of pregnancy, Disp: 40 tablet, Rfl: 1   NIFEdipine (ADALAT CC) 30 MG 24 hr tablet, Take 1 tablet (30 mg total) by mouth daily., Disp: 30 tablet, Rfl: 1   ondansetron (ZOFRAN-ODT) 4 MG disintegrating tablet, Take 4 mg by mouth every 8 (eight) hours as needed for nausea or vomiting., Disp: , Rfl:    Prenatal Vit-Fe Fumarate-FA (MULTIVITAMIN-PRENATAL) 27-0.8 MG TABS tablet, Take 1 tablet by mouth daily at 12 noon., Disp: , Rfl:    Allergies  Allergen Reactions   Oxycodone-Acetaminophen Itching   Oxycodone Itching   Shellfish-Derived Products Hives    Past Medical History:  Diagnosis Date   Anemia    Blount's disease    GERD (gastroesophageal reflux disease)    Hx of chlamydia infection    Hx of gonorrhea    Hx of varicella    Hypertension    Miscarriage    Thalassemia trait      Past Surgical History:  Procedure Laterality Date   CESAREAN SECTION N/A 06/03/2015   Procedure:  CESAREAN SECTION;  Surgeon: Osborn Coho, MD;  Location: [facility];  Service: Obstetrics;  Laterality: N/A;   CESAREAN SECTION N/A 07/03/2023   Procedure: CESAREAN SECTION;  Surgeon: Charlett Nose, MD;  Location: [facility];  Service: Obstetrics;  Laterality: N/A;   LEG SURGERY     for Blount's?disease (bow leg)   TONSILLECTOMY     TUBAL LIGATION Bilateral 07/03/2023   Procedure: BILATERAL TUBAL LIGATION;  Surgeon: Charlett Nose, MD;  Location: [facility];  Service: Obstetrics;  Laterality: Bilateral;    History reviewed. No pertinent family history.  Social History   Tobacco Use   Smoking status: Never   Smokeless tobacco: Never  Vaping Use   Vaping status: Never Used  Substance Use Topics   Alcohol use: Yes    Comment: occasionally   Drug use: No    ROS   Objective:   Vitals: BP (!) 189/81 (BP Location: Left Arm)   Pulse 100   Temp 99.4 F (37.4 C) (Oral)   Resp 16   SpO2 97%   Breastfeeding Yes   Physical Exam Constitutional:      General: She is not in acute distress.    Appearance: Normal appearance. She is well-developed and normal weight. She is not ill-appearing, toxic-appearing or diaphoretic.  HENT:     Head: Normocephalic and atraumatic.     Right Ear: Tympanic membrane, ear canal and external ear normal.  No drainage or tenderness. No middle ear effusion. There is no impacted cerumen. Tympanic membrane is not erythematous or bulging.     Left Ear: Tympanic membrane, ear canal and external ear normal. No drainage or tenderness.  No middle ear effusion. There is no impacted cerumen. Tympanic membrane is not erythematous or bulging.     Nose: Nose normal. No congestion or rhinorrhea.     Mouth/Throat:     Mouth: Mucous membranes are moist. No oral lesions.     Pharynx: No pharyngeal swelling, oropharyngeal exudate, posterior oropharyngeal erythema or uvula swelling.     Tonsils: No tonsillar exudate or tonsillar abscesses. 0 on the right. 0 on the  left.  Eyes:     General: No scleral icterus.       Right eye: No discharge.        Left eye: No discharge.     Extraocular Movements: Extraocular movements intact.     Right eye: Normal extraocular motion.     Left eye: Normal extraocular motion.     Conjunctiva/sclera: Conjunctivae normal.  Cardiovascular:     Rate and Rhythm: Normal rate and regular rhythm.     Heart sounds: Normal heart sounds. No murmur heard.    No friction rub. No gallop.  Pulmonary:     Effort: Pulmonary effort is normal. No respiratory distress.     Breath sounds: No stridor. No wheezing, rhonchi or rales.  Chest:     Chest wall: No tenderness.  Musculoskeletal:     Cervical back: Normal range of motion and neck supple.  Lymphadenopathy:     Cervical: No cervical adenopathy.  Skin:    General: Skin is warm and dry.  Neurological:     General: No focal deficit present.     Mental Status: She is alert and oriented to person, place, and time.  Psychiatric:        Mood and Affect: Mood normal.        Behavior: Behavior normal.    Results for orders placed or performed during the hospital encounter of 12/24/23 (from the past 24 hours)  POC Covid19/Flu A&B Antigen     Status: None   Collection Time: 12/24/23  9:31 AM  Result Value Ref Range   Influenza A Antigen, POC Negative Negative   Influenza B Antigen, POC Negative Negative   Covid Antigen, POC Negative Negative    Assessment and Plan :   PDMP not reviewed this encounter.  1. Acute viral syndrome   2. Essential hypertension    Will defer imaging given clear pulmonary exam.  Suspect viral URI, viral syndrome. Physical exam findings reassuring and vital signs stable for discharge. Advised supportive care, offered symptomatic relief. Counseled patient on potential for adverse effects with medications prescribed/recommended today, ER and return-to-clinic precautions discussed, patient verbalized understanding.     Wallis Bamberg, [place] 12/24/23  [phone]

## 2023-12-24 NOTE — ED Triage Notes (Signed)
Pt reports cough and chest tightness x 1 day. Pt is breastfeeding.

## 2023-12-24 NOTE — Discharge Instructions (Signed)
We will manage this as a viral syndrome. For sore throat or cough try using a honey-based tea. Use 3 teaspoons of honey with juice squeezed from half lemon. Place shaved pieces of ginger into 1/2-1 cup of water and warm over stove top. Then mix the ingredients and repeat every 4 hours as needed. Please take Tylenol 500mg -650mg  once every 6 hours for fevers, aches and pains. Hydrate very well with at least 2 liters (64 ounces) of water. Eat light meals such as soups (chicken and noodles, chicken wild rice, vegetable).  Do not eat any foods that you are allergic to.  Start an antihistamine like Zyrtec (10mg  daily) for postnasal drainage, sinus congestion.  You can take this together with cough syrup as needed.
